# Patient Record
Sex: Male | Born: 1980 | Marital: Married | Smoking: Never smoker
Health system: Southern US, Community
[De-identification: ages and names within clinical notes are randomized; demographics above are authoritative.]

## PROBLEM LIST (undated history)

## (undated) ENCOUNTER — Ambulatory Visit (HOSPITAL_COMMUNITY): Discharge status: Absent without leave | Payer: PRIVATE HEALTH INSURANCE

## (undated) DIAGNOSIS — K219 Gastro-esophageal reflux disease without esophagitis: Secondary | ICD-10-CM

## (undated) DIAGNOSIS — R739 Hyperglycemia, unspecified: Secondary | ICD-10-CM

## (undated) DIAGNOSIS — R195 Other fecal abnormalities: Secondary | ICD-10-CM

## (undated) DIAGNOSIS — K5900 Constipation, unspecified: Secondary | ICD-10-CM

## (undated) DIAGNOSIS — O98719 Human immunodeficiency virus [HIV] disease complicating pregnancy, unspecified trimester: Secondary | ICD-10-CM

## (undated) DIAGNOSIS — Z227 Latent tuberculosis: Principal | ICD-10-CM

## (undated) DIAGNOSIS — B20 Human immunodeficiency virus [HIV] disease: Secondary | ICD-10-CM

## (undated) DIAGNOSIS — B86 Scabies: Principal | ICD-10-CM

## (undated) HISTORY — DX: Other fecal abnormalities: R19.5

## (undated) HISTORY — DX: Constipation, unspecified: K59.00

## (undated) HISTORY — DX: Scabies: B86

## (undated) HISTORY — DX: Human immunodeficiency virus (HIV) disease: B20

## (undated) HISTORY — DX: Hyperglycemia, unspecified: R73.9

## (undated) HISTORY — DX: Human immunodeficiency virus (HIV) disease complicating pregnancy, unspecified trimester: O98.719

## (undated) HISTORY — PX: OTHER SURGICAL HISTORY: SHX169

## (undated) HISTORY — DX: Gastro-esophageal reflux disease without esophagitis: K21.9

## (undated) HISTORY — DX: Latent tuberculosis: Z22.7

---

## 2013-01-17 ENCOUNTER — Telehealth: Payer: Self-pay

## 2013-01-17 NOTE — Telephone Encounter (Signed)
[facility] referral for patient and his wife. They do not speak English and will need interpreter services. Sponsored through [employer] , Evon Slack  [phone]. [facility] using interperter  Badal Gurung @ [phone] Patients do not have a telephone.   Laurell Josephs, RN

## 2013-01-23 ENCOUNTER — Other Ambulatory Visit: Payer: Self-pay | Admitting: Infectious Diseases

## 2013-01-23 ENCOUNTER — Ambulatory Visit
Admission: RE | Admit: 2013-01-23 | Discharge: 2013-01-23 | Disposition: A | Discharge status: Home / Self Care | Payer: No Typology Code available for payment source | Source: Ambulatory Visit | Attending: Infectious Diseases | Admitting: Infectious Diseases

## 2013-01-23 DIAGNOSIS — A150 Tuberculosis of lung: Secondary | ICD-10-CM

## 2013-01-30 ENCOUNTER — Ambulatory Visit: Payer: Self-pay

## 2013-01-30 ENCOUNTER — Ambulatory Visit: Payer: Medicaid Other

## 2013-01-30 ENCOUNTER — Other Ambulatory Visit (HOSPITAL_COMMUNITY)
Admission: RE | Admit: 2013-01-30 | Discharge: 2013-01-30 | Disposition: A | Discharge status: Home / Self Care | Payer: Medicaid Other | Source: Ambulatory Visit | Attending: Infectious Disease | Admitting: Infectious Disease

## 2013-01-30 DIAGNOSIS — Z113 Encounter for screening for infections with a predominantly sexual mode of transmission: Secondary | ICD-10-CM | POA: Insufficient documentation

## 2013-01-30 DIAGNOSIS — B20 Human immunodeficiency virus [HIV] disease: Secondary | ICD-10-CM

## 2013-01-30 LAB — CBC WITH DIFFERENTIAL/PLATELET
HCT: 44.4 % (ref 39.0–52.0)
Hemoglobin: 15.2 g/dL (ref 13.0–17.0)
Lymphocytes Relative: 26 % (ref 12–46)
Lymphs Abs: 1.5 10*3/uL (ref 0.7–4.0)
MCHC: 34.2 g/dL (ref 30.0–36.0)
Monocytes Absolute: 0.5 10*3/uL (ref 0.1–1.0)
Monocytes Relative: 8 % (ref 3–12)
Neutro Abs: 3.5 10*3/uL (ref 1.7–7.7)
Neutrophils Relative %: 61 % (ref 43–77)
RBC: 5.3 MIL/uL (ref 4.22–5.81)
WBC: 5.7 10*3/uL (ref 4.0–10.5)

## 2013-01-30 LAB — COMPLETE METABOLIC PANEL WITH GFR
ALT: 67 U/L — ABNORMAL HIGH (ref 0–53)
AST: 61 U/L — ABNORMAL HIGH (ref 0–37)
Alkaline Phosphatase: 60 U/L (ref 39–117)
CO2: 27 mEq/L (ref 19–32)
Chloride: 103 mEq/L (ref 96–112)
Creat: 0.8 mg/dL (ref 0.50–1.35)
GFR, Est African American: >89 mL/min
Glucose, Bld: 109 mg/dL — ABNORMAL HIGH (ref 70–99)
Potassium: 4 mEq/L (ref 3.5–5.3)
Sodium: 136 mEq/L (ref 135–145)
Total Bilirubin: 0.6 mg/dL (ref 0.3–1.2)

## 2013-01-30 LAB — LIPID PANEL
Cholesterol: 224 mg/dL — ABNORMAL HIGH (ref 0–200)
Total CHOL/HDL Ratio: 5.5 Ratio
Triglycerides: 241 mg/dL — ABNORMAL HIGH (ref <150)
VLDL: 48 mg/dL — ABNORMAL HIGH (ref 0–40)

## 2013-01-31 LAB — HEPATITIS B SURFACE ANTIGEN: Hepatitis B Surface Ag: NEGATIVE

## 2013-01-31 LAB — HEPATITIS A ANTIBODY, TOTAL: Hep A Total Ab: POSITIVE — AB

## 2013-01-31 LAB — URINALYSIS
Bilirubin Urine: NEGATIVE
Glucose, UA: NEGATIVE mg/dL
Specific Gravity, Urine: 1.007 (ref 1.005–1.030)

## 2013-01-31 LAB — HEPATITIS C ANTIBODY: HCV Ab: NEGATIVE

## 2013-01-31 LAB — HEPATITIS B SURFACE ANTIBODY,QUALITATIVE: Hep B S Ab: NONREACTIVE

## 2013-01-31 NOTE — Progress Notes (Signed)
Pt is a [place] immigrant.   He has been HIV positive since 2011 and has never been ART medications but has taken Septra before.   Source of infection is unknown. I did not ask sexual history questions since multiple people present at intake and we we speaking thorough an interpreter.  Pt is married and his spouse is also HIV positive.  Immigration physical documents CXR suggest discrete linear opacity , sputum smears and culture were negative on 08-21-2012.  RPR negative 08-21-2012 Last CD-4  =[phone]    Laurell Josephs, RN

## 2013-02-06 LAB — HIV-1 GENOTYPR PLUS

## 2013-02-19 ENCOUNTER — Encounter: Payer: Self-pay | Admitting: Infectious Disease

## 2013-02-19 ENCOUNTER — Ambulatory Visit (INDEPENDENT_AMBULATORY_CARE_PROVIDER_SITE_OTHER): Payer: Medicaid Other | Admitting: Infectious Disease

## 2013-02-19 VITALS — BP 160/94 | HR 90 | Temp 98.2°F | Wt 150.0 lb

## 2013-02-19 DIAGNOSIS — B20 Human immunodeficiency virus [HIV] disease: Secondary | ICD-10-CM | POA: Insufficient documentation

## 2013-02-19 DIAGNOSIS — Z23 Encounter for immunization: Secondary | ICD-10-CM

## 2013-02-19 DIAGNOSIS — J020 Streptococcal pharyngitis: Secondary | ICD-10-CM

## 2013-02-19 DIAGNOSIS — J029 Acute pharyngitis, unspecified: Secondary | ICD-10-CM

## 2013-02-19 DIAGNOSIS — R894 Abnormal immunological findings in specimens from other organs, systems and tissues: Secondary | ICD-10-CM

## 2013-02-19 DIAGNOSIS — R768 Other specified abnormal immunological findings in serum: Secondary | ICD-10-CM

## 2013-02-19 MED ORDER — EMTRICITABINE-TENOFOVIR DF 200-300 MG PO TABS: 1.0000 | ORAL_TABLET | Freq: Every day | ORAL | Status: DC

## 2013-02-19 MED ORDER — DOLUTEGRAVIR SODIUM 50 MG PO TABS: 50.0000 mg | ORAL_TABLET | Freq: Every day | ORAL | Status: DC

## 2013-02-19 NOTE — Addendum Note (Signed)
Addended by: Randall Hiss on: 02/19/2013 06:34 PM   Modules accepted: Level of Service

## 2013-02-19 NOTE — Progress Notes (Addendum)
Subjective:    Patient ID: Samuel Bond, male    DOB: [birth year], 32 y.o.   MRN: [id number]  HPI   32 year old [place] man who comes to clinic today accompanied by his wife. He is HIV infected he has never been on antiretroviral therapy his viral load is in the 7000 range with a wild type virus and to resistance dictations. His CD4 count is just above 300.  He has been worked up for possible tuberculosis due to a linear scar on chest x-ray with 3 sputum that were negative for AFB by smear and culture.  He had no other specific complaints prior to his traveling I states her since his arrival. He is accompanied again by his HIV infected wife who has been on antiretroviral therapy during her pregnancy but not for the past 2 years.  I spent greater than 60 minutes with the patient including greater than 50% of time in face to face counsel of the patient regarding  HIV, natural history current recommendations for therapy and antiretroviral regimens that we can consider for him and in coordination of their care. We decided to go with Tivicay and Truvada.      Review of Systems  Constitutional: Negative for fever, chills, diaphoresis, activity change, appetite change, fatigue and unexpected weight change.  HENT: Negative for congestion, sore throat, rhinorrhea, sneezing, trouble swallowing and sinus pressure.   Eyes: Negative for photophobia and visual disturbance.  Respiratory: Negative for cough, chest tightness, shortness of breath, wheezing and stridor.   Cardiovascular: Negative for chest pain, palpitations and leg swelling.  Gastrointestinal: Negative for nausea, vomiting, abdominal pain, diarrhea, constipation, blood in stool, abdominal distention and anal bleeding.  Genitourinary: Negative for dysuria, hematuria, flank pain and difficulty urinating.  Musculoskeletal: Negative for myalgias, back pain, joint swelling, arthralgias and gait problem.  Skin: Negative for color change,  pallor, rash and wound.  Neurological: Negative for dizziness, tremors, weakness and light-headedness.  Hematological: Negative for adenopathy. Does not bruise/bleed easily.  Psychiatric/Behavioral: Negative for behavioral problems, confusion, sleep disturbance, dysphoric mood, decreased concentration and agitation.       Objective:   Physical Exam  Constitutional: He is oriented to person, place, and time. He appears well-developed and well-nourished. No distress.  HENT:  Head: Normocephalic and atraumatic.  Mouth/Throat: Oropharynx is clear and moist. No oropharyngeal exudate.  Eyes: Conjunctivae and EOM are normal. Pupils are equal, round, and reactive to light. No scleral icterus.  Neck: Normal range of motion. Neck supple. No JVD present.  Cardiovascular: Normal rate, regular rhythm and normal heart sounds.  Exam reveals no gallop and no friction rub.   No murmur heard. Pulmonary/Chest: Effort normal and breath sounds normal. No respiratory distress. He has no wheezes. He has no rales. He exhibits no tenderness.  Abdominal: He exhibits no distension and no mass. There is no tenderness. There is no rebound and no guarding.  Musculoskeletal: He exhibits no edema and no tenderness.  Lymphadenopathy:    He has no cervical adenopathy.  Neurological: He is alert and oriented to person, place, and time. He has normal reflexes. He exhibits normal muscle tone. Coordination normal.  Skin: Skin is warm and dry. He is not diaphoretic. No erythema. No pallor.  Psychiatric: He has a normal mood and affect. His behavior is normal. Judgment and thought content normal.          Assessment & Plan:  HIV : start Tivicay and Truvada and bring back to clinic in 2 months time  Healthcare maintenance patient will get pneumonia shot today and hep B #1  Hep A positive: so no need for vax

## 2013-03-20 ENCOUNTER — Ambulatory Visit (INDEPENDENT_AMBULATORY_CARE_PROVIDER_SITE_OTHER): Payer: Medicaid Other | Admitting: *Deleted

## 2013-03-20 DIAGNOSIS — B20 Human immunodeficiency virus [HIV] disease: Secondary | ICD-10-CM

## 2013-03-20 DIAGNOSIS — Z23 Encounter for immunization: Secondary | ICD-10-CM

## 2013-03-20 NOTE — Patient Instructions (Signed)
Return in December for #3 dose.

## 2013-04-09 ENCOUNTER — Other Ambulatory Visit: Payer: Medicaid Other

## 2013-04-09 DIAGNOSIS — B20 Human immunodeficiency virus [HIV] disease: Secondary | ICD-10-CM

## 2013-04-09 LAB — COMPLETE METABOLIC PANEL WITH GFR
ALT: 15 U/L (ref 0–53)
AST: 29 U/L (ref 0–37)
Albumin: 4.5 g/dL (ref 3.5–5.2)
Alkaline Phosphatase: 73 U/L (ref 39–117)
BUN: 11 mg/dL (ref 6–23)
Calcium: 10.2 mg/dL (ref 8.4–10.5)
Chloride: 104 mEq/L (ref 96–112)
Potassium: 3.9 mEq/L (ref 3.5–5.3)
Sodium: 138 mEq/L (ref 135–145)
Total Protein: 8 g/dL (ref 6.0–8.3)

## 2013-04-09 LAB — CBC WITH DIFFERENTIAL/PLATELET
Basophils Absolute: 0 10*3/uL (ref 0.0–0.1)
Eosinophils Absolute: 0.2 10*3/uL (ref 0.0–0.7)
Eosinophils Relative: 4 % (ref 0–5)
HCT: 45.6 % (ref 39.0–52.0)
MCH: 28.9 pg (ref 26.0–34.0)
MCHC: 34.2 g/dL (ref 30.0–36.0)
MCV: 84.6 fL (ref 78.0–100.0)
Monocytes Absolute: 0.4 10*3/uL (ref 0.1–1.0)
Platelets: 239 10*3/uL (ref 150–400)
RDW: 14.1 % (ref 11.5–15.5)

## 2013-04-10 LAB — T-HELPER CELL (CD4) - (RCID CLINIC ONLY)
CD4 % Helper T Cell: 27 % — ABNORMAL LOW (ref 33–55)
CD4 T Cell Abs: 630 uL (ref 400–2700)

## 2013-04-10 LAB — HIV-1 RNA QUANT-NO REFLEX-BLD
HIV 1 RNA Quant: <20 copies/mL (ref <20)
HIV-1 RNA Quant, Log: <1.3 {Log} (ref <1.30)

## 2013-04-23 ENCOUNTER — Ambulatory Visit (INDEPENDENT_AMBULATORY_CARE_PROVIDER_SITE_OTHER): Payer: Medicaid Other | Admitting: Infectious Disease

## 2013-04-23 ENCOUNTER — Encounter: Payer: Self-pay | Admitting: Infectious Disease

## 2013-04-23 VITALS — BP 150/93 | HR 73 | Temp 98.4°F | Ht 68.0 in | Wt 151.0 lb

## 2013-04-23 DIAGNOSIS — A150 Tuberculosis of lung: Secondary | ICD-10-CM

## 2013-04-23 DIAGNOSIS — B20 Human immunodeficiency virus [HIV] disease: Secondary | ICD-10-CM

## 2013-04-23 DIAGNOSIS — K219 Gastro-esophageal reflux disease without esophagitis: Secondary | ICD-10-CM

## 2013-04-23 DIAGNOSIS — Z227 Latent tuberculosis: Secondary | ICD-10-CM

## 2013-04-23 MED ORDER — OMEPRAZOLE 20 MG PO CPDR: 20.0000 mg | DELAYED_RELEASE_CAPSULE | Freq: Every day | ORAL | Status: DC

## 2013-04-23 NOTE — Progress Notes (Signed)
  Subjective:    Patient ID: Samuel Bond, male    DOB: 02-17-1981, 32 y.o.   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 He has perfect virological suppresion with VL <20 and CD4 above 600 now  He has recently started rx for LTB and had problems with reflux for which he seeks rx from me. OTherwise no complaints.    Review of Systems  Constitutional: Negative for fever, chills, diaphoresis, activity change, appetite change, fatigue and unexpected weight change.  HENT: Negative for congestion, sore throat, rhinorrhea, sneezing, trouble swallowing and sinus pressure.   Eyes: Negative for photophobia and visual disturbance.  Respiratory: Negative for cough, chest tightness, shortness of breath, wheezing and stridor.   Cardiovascular: Negative for chest pain, palpitations and leg swelling.  Gastrointestinal: Negative for nausea, vomiting, abdominal pain, diarrhea, constipation, blood in stool, abdominal distention and anal bleeding.  Genitourinary: Negative for dysuria, hematuria, flank pain and difficulty urinating.  Musculoskeletal: Negative for myalgias, back pain, joint swelling, arthralgias and gait problem.  Skin: Negative for color change, pallor, rash and wound.  Neurological: Negative for dizziness, tremors, weakness and light-headedness.  Hematological: Negative for adenopathy. Does not bruise/bleed easily.  Psychiatric/Behavioral: Negative for behavioral problems, confusion, sleep disturbance, dysphoric mood, decreased concentration and agitation.       Objective:   Physical Exam  Constitutional: He is oriented to person, place, and time. He appears well-developed and well-nourished. No distress.  HENT:  Head: Normocephalic and atraumatic.  Mouth/Throat: Oropharynx is clear and moist. No oropharyngeal exudate.  Eyes: Conjunctivae and EOM are normal. Pupils are equal, round, and reactive to light. No  scleral icterus.  Neck: Normal range of motion. Neck supple. No JVD present.  Cardiovascular: Normal rate, regular rhythm and normal heart sounds.  Exam reveals no gallop and no friction rub.   No murmur heard. Pulmonary/Chest: Effort normal and breath sounds normal. No respiratory distress. He has no wheezes. He has no rales. He exhibits no tenderness.  Abdominal: He exhibits no distension and no mass. There is no tenderness. There is no rebound and no guarding.  Musculoskeletal: He exhibits no edema and no tenderness.  Lymphadenopathy:    He has no cervical adenopathy.  Neurological: He is alert and oriented to person, place, and time. He has normal reflexes. He exhibits normal muscle tone. Coordination normal.  Skin: Skin is warm and dry. He is not diaphoretic. No erythema. No pallor.  Psychiatric: He has a normal mood and affect. His behavior is normal. Judgment and thought content normal.          Assessment & Plan:  HIV : start Tivicay and Truvada and bring back in 3 months  GERD: start prilosec  LTB: on rx for TB once done with this take off his pPI and see if doesn't need this

## 2013-07-24 ENCOUNTER — Other Ambulatory Visit: Payer: Self-pay

## 2013-08-11 ENCOUNTER — Other Ambulatory Visit (INDEPENDENT_AMBULATORY_CARE_PROVIDER_SITE_OTHER): Payer: Medicaid Other

## 2013-08-11 DIAGNOSIS — B20 Human immunodeficiency virus [HIV] disease: Secondary | ICD-10-CM

## 2013-08-11 LAB — CBC WITH DIFFERENTIAL/PLATELET
Basophils Absolute: 0 10*3/uL (ref 0.0–0.1)
Eosinophils Relative: 2 % (ref 0–5)
HCT: 47.1 % (ref 39.0–52.0)
Hemoglobin: 16.8 g/dL (ref 13.0–17.0)
Lymphocytes Relative: 27 % (ref 12–46)
Lymphs Abs: 1.8 10*3/uL (ref 0.7–4.0)
MCV: 84.3 fL (ref 78.0–100.0)
Monocytes Absolute: 0.4 10*3/uL (ref 0.1–1.0)
Monocytes Relative: 6 % (ref 3–12)
Neutrophils Relative %: 65 % (ref 43–77)
RBC: 5.59 MIL/uL (ref 4.22–5.81)
RDW: 13.7 % (ref 11.5–15.5)
WBC: 6.5 10*3/uL (ref 4.0–10.5)

## 2013-08-11 LAB — COMPLETE METABOLIC PANEL WITH GFR
AST: 25 U/L (ref 0–37)
Albumin: 4.9 g/dL (ref 3.5–5.2)
Alkaline Phosphatase: 83 U/L (ref 39–117)
BUN: 10 mg/dL (ref 6–23)
Creat: 0.91 mg/dL (ref 0.50–1.35)
GFR, Est African American: >89 mL/min
Glucose, Bld: 105 mg/dL — ABNORMAL HIGH (ref 70–99)
Potassium: 3.9 mEq/L (ref 3.5–5.3)
Sodium: 138 mEq/L (ref 135–145)
Total Bilirubin: 0.3 mg/dL (ref 0.3–1.2)
Total Protein: 8.4 g/dL — ABNORMAL HIGH (ref 6.0–8.3)

## 2013-08-13 LAB — HIV-1 RNA QUANT-NO REFLEX-BLD: HIV-1 RNA Quant, Log: <1.3 {Log} (ref <1.30)

## 2013-08-25 ENCOUNTER — Ambulatory Visit: Payer: Medicaid Other | Admitting: Infectious Disease

## 2013-08-28 ENCOUNTER — Ambulatory Visit (INDEPENDENT_AMBULATORY_CARE_PROVIDER_SITE_OTHER): Payer: Medicaid Other | Admitting: *Deleted

## 2013-08-28 DIAGNOSIS — Z23 Encounter for immunization: Secondary | ICD-10-CM

## 2013-08-28 DIAGNOSIS — B20 Human immunodeficiency virus [HIV] disease: Secondary | ICD-10-CM

## 2013-09-03 ENCOUNTER — Ambulatory Visit (INDEPENDENT_AMBULATORY_CARE_PROVIDER_SITE_OTHER): Payer: Medicaid Other | Admitting: Infectious Disease

## 2013-09-03 ENCOUNTER — Encounter: Payer: Self-pay | Admitting: Infectious Disease

## 2013-09-03 VITALS — BP 124/85 | HR 71 | Temp 98.2°F | Wt 150.0 lb

## 2013-09-03 DIAGNOSIS — Z227 Latent tuberculosis: Secondary | ICD-10-CM

## 2013-09-03 DIAGNOSIS — K219 Gastro-esophageal reflux disease without esophagitis: Secondary | ICD-10-CM

## 2013-09-03 DIAGNOSIS — Z23 Encounter for immunization: Secondary | ICD-10-CM

## 2013-09-03 DIAGNOSIS — A150 Tuberculosis of lung: Secondary | ICD-10-CM

## 2013-09-03 DIAGNOSIS — B20 Human immunodeficiency virus [HIV] disease: Secondary | ICD-10-CM

## 2013-09-03 MED ORDER — ABACAVIR-DOLUTEGRAVIR-LAMIVUD 600-50-300 MG PO TABS: 1.0000 | ORAL_TABLET | Freq: Every day | ORAL | Status: DC

## 2013-09-03 NOTE — Progress Notes (Signed)
  Subjective:    Patient ID: Samuel Bond, male    DOB: 11-26-1980, 32 y.o.   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 He has perfect virological suppresion with VL <20 and CD4 above 600 whom has been doing well and who would like to change over to [facility] which we will do today. He is without other complaints.   Review of Systems  Constitutional: Negative for fever, chills, diaphoresis, activity change, appetite change, fatigue and unexpected weight change.  HENT: Negative for congestion, rhinorrhea, sinus pressure, sneezing, sore throat and trouble swallowing.   Eyes: Negative for photophobia and visual disturbance.  Respiratory: Negative for cough, chest tightness, shortness of breath, wheezing and stridor.   Cardiovascular: Negative for chest pain, palpitations and leg swelling.  Gastrointestinal: Negative for nausea, vomiting, abdominal pain, diarrhea, constipation, blood in stool, abdominal distention and anal bleeding.  Genitourinary: Negative for dysuria, hematuria, flank pain and difficulty urinating.  Musculoskeletal: Negative for arthralgias, back pain, gait problem, joint swelling and myalgias.  Skin: Negative for color change, pallor, rash and wound.  Neurological: Negative for dizziness, tremors, weakness and light-headedness.  Hematological: Negative for adenopathy. Does not bruise/bleed easily.  Psychiatric/Behavioral: Negative for behavioral problems, confusion, sleep disturbance, dysphoric mood, decreased concentration and agitation.       Objective:   Physical Exam  Constitutional: He is oriented to person, place, and time. He appears well-developed and well-nourished. No distress.  HENT:  Head: Normocephalic and atraumatic.  Mouth/Throat: Oropharynx is clear and moist. No oropharyngeal exudate.  Eyes: Conjunctivae and EOM are normal. Pupils are equal, round, and reactive to light. No  scleral icterus.  Neck: Normal range of motion. Neck supple. No JVD present.  Cardiovascular: Normal rate, regular rhythm and normal heart sounds.  Exam reveals no gallop and no friction rub.   No murmur heard. Pulmonary/Chest: Effort normal and breath sounds normal. No respiratory distress. He has no wheezes. He has no rales. He exhibits no tenderness.  Abdominal: He exhibits no distension and no mass. There is no tenderness. There is no rebound and no guarding.  Musculoskeletal: He exhibits no edema and no tenderness.  Lymphadenopathy:    He has no cervical adenopathy.  Neurological: He is alert and oriented to person, place, and time. He has normal reflexes. He exhibits normal muscle tone. Coordination normal.  Skin: Skin is warm and dry. He is not diaphoretic. No erythema. No pallor.  Psychiatric: He has a normal mood and affect. His behavior is normal. Judgment and thought content normal.          Assessment & Plan:  HIV : switch to TRIUMEQ and rtc in 2 months. I spent greater than 25 minutes with the patient including greater than 50% of time in face to face counsel of the patient and in coordination of their care.   GERD: ok to use prilosec  LTB: need to check if he has finished this yet  Need for flu vaccine: given

## 2013-09-09 ENCOUNTER — Encounter: Payer: Self-pay | Admitting: Internal Medicine

## 2013-09-09 ENCOUNTER — Ambulatory Visit: Payer: Medicaid Other | Attending: Internal Medicine | Admitting: Internal Medicine

## 2013-09-09 VITALS — BP 128/92 | HR 78 | Temp 98.7°F | Resp 14 | Ht 64.0 in | Wt 149.2 lb

## 2013-09-09 DIAGNOSIS — J209 Acute bronchitis, unspecified: Secondary | ICD-10-CM | POA: Insufficient documentation

## 2013-09-09 DIAGNOSIS — Z21 Asymptomatic human immunodeficiency virus [HIV] infection status: Secondary | ICD-10-CM | POA: Insufficient documentation

## 2013-09-09 LAB — COMPLETE METABOLIC PANEL WITH GFR
AST: 52 U/L — ABNORMAL HIGH (ref 0–37)
Albumin: 4.7 g/dL (ref 3.5–5.2)
BUN: 9 mg/dL (ref 6–23)
Calcium: 9.6 mg/dL (ref 8.4–10.5)
Chloride: 105 mEq/L (ref 96–112)
Creat: 0.88 mg/dL (ref 0.50–1.35)
GFR, Est Non African American: >89 mL/min
Glucose, Bld: 103 mg/dL — ABNORMAL HIGH (ref 70–99)
Potassium: 3.9 mEq/L (ref 3.5–5.3)
Sodium: 138 mEq/L (ref 135–145)
Total Protein: 7.7 g/dL (ref 6.0–8.3)

## 2013-09-09 LAB — CBC WITH DIFFERENTIAL/PLATELET
Basophils Absolute: 0 10*3/uL (ref 0.0–0.1)
Eosinophils Absolute: 0.1 10*3/uL (ref 0.0–0.7)
Eosinophils Relative: 2 % (ref 0–5)
HCT: 45.2 % (ref 39.0–52.0)
Hemoglobin: 16.2 g/dL (ref 13.0–17.0)
Lymphocytes Relative: 22 % (ref 12–46)
Lymphs Abs: 1.5 10*3/uL (ref 0.7–4.0)
MCV: 85.6 fL (ref 78.0–100.0)
Monocytes Absolute: 0.5 10*3/uL (ref 0.1–1.0)
Monocytes Relative: 7 % (ref 3–12)
RBC: 5.28 MIL/uL (ref 4.22–5.81)
RDW: 13.3 % (ref 11.5–15.5)
WBC: 6.9 10*3/uL (ref 4.0–10.5)

## 2013-09-09 LAB — TSH: TSH: 3.71 u[IU]/mL (ref 0.350–4.500)

## 2013-09-09 LAB — LIPID PANEL
HDL: 33 mg/dL — ABNORMAL LOW (ref >39)
LDL Cholesterol: 82 mg/dL (ref 0–99)
Triglycerides: 121 mg/dL (ref <150)
VLDL: 24 mg/dL (ref 0–40)

## 2013-09-09 MED ORDER — AZITHROMYCIN 500 MG PO TABS: ORAL_TABLET | ORAL | Status: DC

## 2013-09-09 MED ORDER — GUAIFENESIN-CODEINE 100-10 MG/5ML PO SOLN: 5.0000 mL | ORAL | Status: DC | PRN

## 2013-09-09 NOTE — Progress Notes (Signed)
Pt is here to establish. Was recently treated for TB exposure. Pt is HIV positive.

## 2013-09-09 NOTE — Progress Notes (Signed)
Patient ID: Samuel Bond, male   DOB: 22-Oct-1980, 32 y.o.   MRN: [id number]   CC:  HPI: 32 year old male here to establish care. He is a history of HIV. 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 He is under the care of Dr. Earlean Polka June and started on Tivicay and Truvada. He has perfect virological suppresion with VL <20 and CD4 above 600 now . He recently changed over to [facility]   Today he states that he's had a cough for the last 2 weeks. He denies any fever denies any headache. Denies any runny nose or postnasal drip. He does have a sore throat.  No Known Allergies Past Medical History  Diagnosis Date  . HIV disease   . Maternal HIV infection during pregnancy    Current Outpatient Prescriptions on File Prior to Visit  Medication Sig Dispense Refill  . Abacavir-Dolutegravir-Lamivud (TRIUMEQ) 600-50-300 MG TABS Take 1 tablet by mouth daily.  30 tablet  11  . omeprazole (PRILOSEC) 20 MG capsule Take 1 capsule (20 mg total) by mouth daily.  30 capsule  11   No current facility-administered medications on file prior to visit.   Family History  Problem Relation Age of Onset  . Asthma Father    History   Social History  . Marital Status: Married    Spouse Name: N/A    Number of Children: N/A  . Years of Education: N/A   Occupational History  . Not on file.   Social History Main Topics  . Smoking status: Never Smoker   . Smokeless tobacco: Not on file  . Alcohol Use: No  . Drug Use: No  . Sexual Activity: Yes    Partners: Female     Comment: pt declined condoms   Other Topics Concern  . Not on file   Social History Narrative  . No narrative on file    Review of Systems  Constitutional: Negative for fever, chills, diaphoresis, activity change, appetite change and fatigue.  HENT: Negative for ear pain, nosebleeds, congestion, facial swelling, rhinorrhea, neck pain, neck stiffness and ear discharge.   Eyes: Negative for pain, discharge,  redness, itching and visual disturbance.  Respiratory: Positive for cough, choking, chest tightness, shortness of breath, wheezing and stridor.   Cardiovascular: Negative for chest pain, palpitations and leg swelling.  Gastrointestinal: Negative for abdominal distention.  Genitourinary: Negative for dysuria, urgency, frequency, hematuria, flank pain, decreased urine volume, difficulty urinating and dyspareunia.  Musculoskeletal: Negative for back pain, joint swelling, arthralgias and gait problem.  Neurological: Negative for dizziness, tremors, seizures, syncope, facial asymmetry, speech difficulty, weakness, light-headedness, numbness and headaches.  Hematological: Negative for adenopathy. Does not bruise/bleed easily.  Psychiatric/Behavioral: Negative for hallucinations, behavioral problems, confusion, dysphoric mood, decreased concentration and agitation.    Objective:   Filed Vitals:   09/09/13 0905  BP: 128/92  Pulse: 78  Temp: 98.7 F (37.1 C)  Resp: 14    Physical Exam  Constitutional: Appears well-developed and well-nourished. No distress.  HENT: Normocephalic. External right and left ear normal. Oropharynx is clear and moist.  Eyes: Conjunctivae and EOM are normal. PERRLA, no scleral icterus.  Neck: Normal ROM. Neck supple. No JVD. No tracheal deviation. No thyromegaly.  CVS: RRR, S1/S2 +, no murmurs, no gallops, no carotid bruit.  Pulmonary: Effort and breath sounds normal, no stridor, rhonchi, wheezes, rales.  Abdominal: Soft. BS +,  no distension, tenderness, rebound or guarding.  Musculoskeletal: Normal range of motion. No edema and no tenderness.  Lymphadenopathy:  No lymphadenopathy noted, cervical, inguinal. Neuro: Alert. Normal reflexes, muscle tone coordination. No cranial nerve deficit. Skin: Skin is warm and dry. No rash noted. Not diaphoretic. No erythema. No pallor.  Psychiatric: Normal mood and affect. Behavior, judgment, thought content normal.   Lab Results   Component Value Date   WBC 6.5 08/11/2013   HGB 16.8 08/11/2013   HCT 47.1 08/11/2013   MCV 84.3 08/11/2013   PLT 240 08/11/2013   Lab Results  Component Value Date   CREATININE 0.91 08/11/2013   BUN 10 08/11/2013   NA 138 08/11/2013   K 3.9 08/11/2013   CL 104 08/11/2013   CO2 25 08/11/2013    No results found for this basename: HGBA1C   Lipid Panel     Component Value Date/Time   CHOL 224* 01/30/2013 1459   TRIG 241* 01/30/2013 1459   HDL 41 01/30/2013 1459   CHOLHDL 5.5 01/30/2013 1459   VLDL 48* 01/30/2013 1459   LDLCALC 135* 01/30/2013 1459       Assessment and plan:   Patient Active Problem List   Diagnosis Date Noted  . GERD (gastroesophageal reflux disease) 04/23/2013  . HIV disease 02/19/2013  . Sore throat 02/19/2013    Acute bronchitis We'll obtain a chest x-ray Influenza A and B. probable stroke Prescribe Z-Pak for 7 days Robitussin with codeine If no improvement the patient will need further workup  HIV followed by infectious disease, he is up-to-date with his flu vaccination   Hypertriglyceridemia Will repeat lipid panel in about 3 months. The patient on treatment as needed  Followup in 3 months   The patient was given clear instructions to go to ER or return to medical center if symptoms don't improve, worsen or new problems develop. The patient verbalized understanding. The patient was told to call to get any lab results if not heard anything in the next week.

## 2013-09-15 ENCOUNTER — Ambulatory Visit (HOSPITAL_COMMUNITY)
Admission: RE | Admit: 2013-09-15 | Discharge: 2013-09-15 | Disposition: A | Discharge status: Home / Self Care | Payer: Medicaid Other | Source: Ambulatory Visit | Attending: Internal Medicine | Admitting: Internal Medicine

## 2013-09-15 ENCOUNTER — Telehealth: Payer: Self-pay | Admitting: *Deleted

## 2013-09-15 DIAGNOSIS — R059 Cough, unspecified: Secondary | ICD-10-CM | POA: Insufficient documentation

## 2013-09-15 DIAGNOSIS — J209 Acute bronchitis, unspecified: Secondary | ICD-10-CM

## 2013-09-15 DIAGNOSIS — R05 Cough: Secondary | ICD-10-CM | POA: Insufficient documentation

## 2013-09-15 DIAGNOSIS — M47814 Spondylosis without myelopathy or radiculopathy, thoracic region: Secondary | ICD-10-CM | POA: Insufficient documentation

## 2013-09-15 NOTE — Telephone Encounter (Signed)
Spoke with Lattie Haw and Nicholos Johns at [employer]. Was able to have Durga speak with the patient and make sure he is comfortable with his new regimen.  Per Nida Boatman spoke with the patient: he is tolerating the medication well, has no issues with the regimen, no other questions. Andree Coss, RN

## 2013-09-17 ENCOUNTER — Telehealth: Payer: Self-pay | Admitting: *Deleted

## 2013-09-17 NOTE — Telephone Encounter (Signed)
Message copied by Aronda Burford, [place] R on Wed Sep 17, 2013 10:59 AM ------      Message from: Susie Cassette MD, [facility]      Created: Mon Sep 15, 2013  5:49 PM       Please notify patient of the patient's labs are normal ------

## 2013-09-22 NOTE — Telephone Encounter (Signed)
Thanks Michelle

## 2013-10-28 ENCOUNTER — Other Ambulatory Visit: Payer: Medicaid Other

## 2013-10-30 ENCOUNTER — Telehealth: Payer: Self-pay | Admitting: *Deleted

## 2013-10-30 NOTE — Telephone Encounter (Signed)
Contacted patient with an interpreter. Unable to reach patient. Telephone line was busy.

## 2013-10-30 NOTE — Telephone Encounter (Signed)
Message copied by Archimedes Harold R on Thu Oct 30, 2013 [place] 1:07 PM ------      Message from: Susie CassetteABROL MD, Germain OsgoodNAYANA      Created: Tue Oct 21, 2013  3:37 PM       Notify patient of the chest x-ray done on 12/29 was negative ------

## 2013-11-03 ENCOUNTER — Other Ambulatory Visit (INDEPENDENT_AMBULATORY_CARE_PROVIDER_SITE_OTHER): Payer: Medicaid Other

## 2013-11-03 DIAGNOSIS — B20 Human immunodeficiency virus [HIV] disease: Secondary | ICD-10-CM

## 2013-11-03 LAB — CBC WITH DIFFERENTIAL/PLATELET
BASOS PCT: 0 % (ref 0–1)
Basophils Absolute: 0 10*3/uL (ref 0.0–0.1)
EOS ABS: 0.1 10*3/uL (ref 0.0–0.7)
EOS PCT: 3 % (ref 0–5)
HCT: 44.9 % (ref 39.0–52.0)
Hemoglobin: 15.4 g/dL (ref 13.0–17.0)
Lymphocytes Relative: 22 % (ref 12–46)
Lymphs Abs: 1.1 10*3/uL (ref 0.7–4.0)
MCH: 29.6 pg (ref 26.0–34.0)
MCHC: 34.3 g/dL (ref 30.0–36.0)
MCV: 86.3 fL (ref 78.0–100.0)
Monocytes Absolute: 0.3 10*3/uL (ref 0.1–1.0)
Monocytes Relative: 7 % (ref 3–12)
NEUTROS PCT: 68 % (ref 43–77)
Neutro Abs: 3.3 10*3/uL (ref 1.7–7.7)
PLATELETS: 235 10*3/uL (ref 150–400)
RBC: 5.2 MIL/uL (ref 4.22–5.81)
RDW: 14.6 % (ref 11.5–15.5)
WBC: 4.8 10*3/uL (ref 4.0–10.5)

## 2013-11-04 LAB — COMPLETE METABOLIC PANEL WITH GFR
ALT: 10 U/L (ref 0–53)
AST: 22 U/L (ref 0–37)
Albumin: 4.7 g/dL (ref 3.5–5.2)
Alkaline Phosphatase: 79 U/L (ref 39–117)
BILIRUBIN TOTAL: 0.6 mg/dL (ref 0.2–1.2)
BUN: 9 mg/dL (ref 6–23)
CALCIUM: 9.3 mg/dL (ref 8.4–10.5)
CHLORIDE: 106 meq/L (ref 96–112)
CO2: 23 meq/L (ref 19–32)
Creat: 0.96 mg/dL (ref 0.50–1.35)
GLUCOSE: 97 mg/dL (ref 70–99)
Potassium: 3.9 mEq/L (ref 3.5–5.3)
SODIUM: 137 meq/L (ref 135–145)
TOTAL PROTEIN: 7.6 g/dL (ref 6.0–8.3)

## 2013-11-04 LAB — T-HELPER CELL (CD4) - (RCID CLINIC ONLY)
CD4 % Helper T Cell: 33 % (ref 33–55)
CD4 T CELL ABS: 320 /uL — AB (ref 400–2700)

## 2013-11-04 LAB — HEPATITIS B SURFACE ANTIBODY,QUALITATIVE: Hep B S Ab: POSITIVE — AB

## 2013-11-05 LAB — HIV-1 RNA QUANT-NO REFLEX-BLD
HIV 1 RNA Quant: <20 copies/mL (ref <20)
HIV-1 RNA Quant, Log: <1.3 {Log} (ref <1.30)

## 2013-11-17 ENCOUNTER — Ambulatory Visit (INDEPENDENT_AMBULATORY_CARE_PROVIDER_SITE_OTHER): Payer: Medicaid Other | Admitting: Infectious Disease

## 2013-11-17 ENCOUNTER — Encounter: Payer: Self-pay | Admitting: Infectious Disease

## 2013-11-17 VITALS — BP 131/89 | HR 63 | Temp 98.0°F | Wt 149.0 lb

## 2013-11-17 DIAGNOSIS — K219 Gastro-esophageal reflux disease without esophagitis: Secondary | ICD-10-CM

## 2013-11-17 DIAGNOSIS — B20 Human immunodeficiency virus [HIV] disease: Secondary | ICD-10-CM

## 2013-11-17 NOTE — Progress Notes (Signed)
  Subjective:    Patient ID: Samuel Bond, male    DOB: 1981/09/15, 33 y.o.   MRN: [id number]  HPI   33 year old [name] with HIV and LTB whom I saw in June and started on Tivicay and Truvada. [facility] perfect virological suppression and healthy immune system with healthy CD4 count.  He is doing well although there is concern about whether his Medicaid will be renewed when his 3711 month Medicaid runs out this month.  He may need to be enrolled into ADAP. Review of Systems  Constitutional: Negative for fever, chills, diaphoresis, activity change, appetite change, fatigue and unexpected weight change.  HENT: Negative for congestion, rhinorrhea, sinus pressure, sneezing, sore throat and trouble swallowing.   Eyes: Negative for photophobia and visual disturbance.  Respiratory: Negative for cough, chest tightness, shortness of breath, wheezing and stridor.   Cardiovascular: Negative for chest pain, palpitations and leg swelling.  Gastrointestinal: Negative for nausea, vomiting, abdominal pain, diarrhea, constipation, blood in stool, abdominal distention and anal bleeding.  Genitourinary: Negative for dysuria, hematuria, flank pain and difficulty urinating.  Musculoskeletal: Negative for arthralgias, back pain, gait problem, joint swelling and myalgias.  Skin: Negative for color change, pallor, rash and wound.  Neurological: Negative for dizziness, tremors, weakness and light-headedness.  Hematological: Negative for adenopathy. Does not bruise/bleed easily.  Psychiatric/Behavioral: Negative for behavioral problems, confusion, sleep disturbance, dysphoric mood, decreased concentration and agitation.       Objective:   Physical Exam  Constitutional: He is oriented to person, place, and time. He appears well-developed and well-nourished. No distress.  HENT:  Head: Normocephalic and atraumatic.  Mouth/Throat: Oropharynx is clear and moist. No oropharyngeal exudate.  Eyes:  Conjunctivae and EOM are normal. Pupils are equal, round, and reactive to light. No scleral icterus.  Neck: Normal range of motion. Neck supple. No JVD present.  Cardiovascular: Normal rate, regular rhythm and normal heart sounds.  Exam reveals no gallop and no friction rub.   No murmur heard. Pulmonary/Chest: Effort normal and breath sounds normal. No respiratory distress. He has no wheezes. He has no rales. He exhibits no tenderness.  Abdominal: He exhibits no distension and no mass. There is no tenderness. There is no rebound and no guarding.  Musculoskeletal: He exhibits no edema and no tenderness.  Lymphadenopathy:    He has no cervical adenopathy.  Neurological: He is alert and oriented to person, place, and time. He has normal reflexes. He exhibits normal muscle tone. Coordination normal.  Skin: Skin is warm and dry. He is not diaphoretic. No erythema. No pallor.  Psychiatric: He has a normal mood and affect. His behavior is normal. Judgment and thought content normal.          Assessment & Plan:  HIV : Continue TRIUMEQ and rtc in 1 month to ensure that he is enrolled into ADAP  GERD: None issue for him at this point time we'll discontinue his Prilosec  LTB: need check if he completed therapy for this

## 2013-12-08 ENCOUNTER — Ambulatory Visit: Payer: Medicaid Other | Attending: Internal Medicine | Admitting: Internal Medicine

## 2013-12-08 VITALS — BP 135/87 | HR 74 | Temp 98.4°F | Resp 16 | Ht 68.0 in | Wt 149.4 lb

## 2013-12-08 DIAGNOSIS — B20 Human immunodeficiency virus [HIV] disease: Secondary | ICD-10-CM

## 2013-12-08 NOTE — Progress Notes (Signed)
Here for follow up States he has no other concerns

## 2013-12-08 NOTE — Progress Notes (Signed)
Patient ID: Samuel Bond, male   DOB: Nov 30, 1980, 33 y.o.   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 [facility] perfect virological suppression and healthy immune system with healthy CD4 count. He was last seen here for bronchitis, does not report any chest pain or cardiopulmonary symptoms today  Concerned about his Medicaid running out next month. He is requesting financial assistance     No Known Allergies Past Medical History  Diagnosis Date  . HIV disease   . Maternal HIV infection during pregnancy    Current Outpatient Prescriptions on File Prior to Visit  Medication Sig Dispense Refill  . Abacavir-Dolutegravir-Lamivud (TRIUMEQ) 600-50-300 MG TABS Take 1 tablet by mouth daily.  30 tablet  11   No current facility-administered medications on file prior to visit.   Family History  Problem Relation Age of Onset  . Asthma Father    History   Social History  . Marital Status: Married    Spouse Name: N/A    Number of Children: N/A  . Years of Education: N/A   Occupational History  . Not on file.   Social History Main Topics  . Smoking status: Never Smoker   . Smokeless tobacco: Not on file  . Alcohol Use: No  . Drug Use: No  . Sexual Activity: Yes    Partners: Female     Comment: pt declined condoms   Other Topics Concern  . Not on file   Social History Narrative  . No narrative on file    Review of Systems  Constitutional: Negative for fever, chills, diaphoresis, activity change, appetite change and fatigue.  HENT: Negative for ear pain, nosebleeds, congestion, facial swelling, rhinorrhea, neck pain, neck stiffness and ear discharge.   Eyes: Negative for pain, discharge, redness, itching and visual disturbance.  Respiratory: Negative for cough, choking, chest tightness, shortness of breath, wheezing and stridor.   Cardiovascular: Negative for chest pain,  palpitations and leg swelling.  Gastrointestinal: Negative for abdominal distention.  Genitourinary: Negative for dysuria, urgency, frequency, hematuria, flank pain, decreased urine volume, difficulty urinating and dyspareunia.  Musculoskeletal: Negative for back pain, joint swelling, arthralgias and gait problem.  Neurological: Negative for dizziness, tremors, seizures, syncope, facial asymmetry, speech difficulty, weakness, light-headedness, numbness and headaches.  Hematological: Negative for adenopathy. Does not bruise/bleed easily.  Psychiatric/Behavioral: Negative for hallucinations, behavioral problems, confusion, dysphoric mood, decreased concentration and agitation.    Objective:   Filed Vitals:   12/08/13 1017  BP: 135/87  Pulse: 74  Temp: 98.4 F (36.9 C)  Resp: 16    Physical Exam  Constitutional: Appears well-developed and well-nourished. No distress.  HENT: Normocephalic. External right and left ear normal. Oropharynx is clear and moist.  Eyes: Conjunctivae and EOM are normal. PERRLA, no scleral icterus.  Neck: Normal ROM. Neck supple. No JVD. No tracheal deviation. No thyromegaly.  CVS: RRR, S1/S2 +, no murmurs, no gallops, no carotid bruit.  Pulmonary: Effort and breath sounds normal, no stridor, rhonchi, wheezes, rales.  Abdominal: Soft. BS +,  no distension, tenderness, rebound or guarding.  Musculoskeletal: Normal range of motion. No edema and no tenderness.  Lymphadenopathy: No lymphadenopathy noted, cervical, inguinal. Neuro: Alert. Normal reflexes, muscle tone coordination. No cranial nerve deficit. Skin: Skin is warm and dry. No rash noted. Not diaphoretic. No erythema. No pallor.  Psychiatric: Normal mood and affect. Behavior, judgment, thought content normal.  Lab Results  Component Value Date   WBC 4.8 11/03/2013   HGB 15.4 11/03/2013   HCT 44.9 11/03/2013   MCV 86.3 11/03/2013   PLT 235 11/03/2013   Lab Results  Component Value Date   CREATININE 0.96  11/03/2013   BUN 9 11/03/2013   NA 137 11/03/2013   K 3.9 11/03/2013   CL 106 11/03/2013   CO2 23 11/03/2013    Lab Results  Component Value Date   HGBA1C 5.4 09/09/2013   Lipid Panel     Component Value Date/Time   CHOL 139 09/09/2013 0926   TRIG 121 09/09/2013 0926   HDL 33* 09/09/2013 0926   CHOLHDL 4.2 09/09/2013 0926   VLDL 24 09/09/2013 0926   LDLCALC 82 09/09/2013 0926       Assessment and plan:   Patient Active Problem List   Diagnosis Date Noted  . GERD (gastroesophageal reflux disease) 04/23/2013  . HIV disease 02/19/2013  . Sore throat 02/19/2013   HIV closely followed by infectious disease, he has a followup appointment in April with infectious disease  GERD continue Prilosec  Financial assistance, refer to financial counselor  Patient stable followup in 6 months       The patient was given clear instructions to go to ER or return to medical center if symptoms don't improve, worsen or new problems develop. The patient verbalized understanding. The patient was told to call to get any lab results if not heard anything in the next week.

## 2013-12-18 ENCOUNTER — Encounter: Payer: Self-pay | Admitting: Infectious Disease

## 2013-12-18 ENCOUNTER — Ambulatory Visit (INDEPENDENT_AMBULATORY_CARE_PROVIDER_SITE_OTHER): Payer: Medicaid Other | Admitting: Infectious Disease

## 2013-12-18 VITALS — BP 124/82 | HR 90 | Temp 98.2°F | Wt 146.0 lb

## 2013-12-18 DIAGNOSIS — B20 Human immunodeficiency virus [HIV] disease: Secondary | ICD-10-CM

## 2013-12-18 NOTE — Progress Notes (Signed)
  Subjective:    Patient ID: Samuel Bond, male    DOB: 1980/12/29, 33 y.o.   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 [facility] to [facility] perfect virological suppression and healthy immune system with healthy CD4 count.  He is doing well although there is concern about whether his Medicaid will be renewed which I understand it will not at in May.   He is working with Marthann SchillerMitch to make sure he meets with Tish and gets enrolled into ADAP.  Review of Systems  Constitutional: Negative for fever, chills, diaphoresis, activity change, appetite change, fatigue and unexpected weight change.  HENT: Negative for congestion, rhinorrhea, sinus pressure, sneezing, sore throat and trouble swallowing.   Eyes: Negative for photophobia and visual disturbance.  Respiratory: Negative for cough, chest tightness, shortness of breath, wheezing and stridor.   Cardiovascular: Negative for chest pain, palpitations and leg swelling.  Gastrointestinal: Negative for nausea, vomiting, abdominal pain, diarrhea, constipation, blood in stool, abdominal distention and anal bleeding.  Genitourinary: Negative for dysuria, hematuria, flank pain and difficulty urinating.  Musculoskeletal: Negative for arthralgias, back pain, gait problem, joint swelling and myalgias.  Skin: Negative for color change, pallor, rash and wound.  Neurological: Negative for dizziness, tremors, weakness and light-headedness.  Hematological: Negative for adenopathy. Does not bruise/bleed easily.  Psychiatric/Behavioral: Negative for behavioral problems, confusion, sleep disturbance, dysphoric mood, decreased concentration and agitation.       Objective:   Physical Exam  Constitutional: He is oriented to person, place, and time. He appears well-developed and well-nourished. No distress.  HENT:  Head: Normocephalic and atraumatic.  Mouth/Throat: Oropharynx is clear  and moist. No oropharyngeal exudate.  Eyes: Conjunctivae and EOM are normal. Pupils are equal, round, and reactive to light. No scleral icterus.  Neck: Normal range of motion. Neck supple. No JVD present.  Cardiovascular: Normal rate, regular rhythm and normal heart sounds.  Exam reveals no gallop and no friction rub.   No murmur heard. Pulmonary/Chest: Effort normal and breath sounds normal. No respiratory distress. He has no wheezes. He has no rales. He exhibits no tenderness.  Abdominal: He exhibits no distension and no mass. There is no tenderness. There is no rebound and no guarding.  Musculoskeletal: He exhibits no edema and no tenderness.  Lymphadenopathy:    He has no cervical adenopathy.  Neurological: He is alert and oriented to person, place, and time. He has normal reflexes. He exhibits normal muscle tone. Coordination normal.  Skin: Skin is warm and dry. He is not diaphoretic. No erythema. No pallor.  Psychiatric: He has a normal mood and affect. His behavior is normal. Judgment and thought content normal.          Assessment & Plan:  HIV : Continue TRIUMEQ ensure that he is enrolled into ADAP. Could need some patient assistance to bridge him if we run into any delay here. There should BE NO INTERUPTION IN ARV in this model pt with high compliance  GERD: None issue for him at this point time we'll discontinue his Prilosec  LTB: need check if he completed therapy for this

## 2013-12-26 ENCOUNTER — Encounter: Payer: Self-pay | Admitting: *Deleted

## 2013-12-29 ENCOUNTER — Other Ambulatory Visit: Payer: Self-pay | Admitting: *Deleted

## 2013-12-29 DIAGNOSIS — B20 Human immunodeficiency virus [HIV] disease: Secondary | ICD-10-CM

## 2013-12-29 MED ORDER — ABACAVIR-DOLUTEGRAVIR-LAMIVUD 600-50-300 MG PO TABS: 1.0000 | ORAL_TABLET | Freq: Every day | ORAL | Status: DC

## 2014-02-02 ENCOUNTER — Encounter: Payer: Self-pay | Admitting: Infectious Disease

## 2014-02-02 ENCOUNTER — Ambulatory Visit (INDEPENDENT_AMBULATORY_CARE_PROVIDER_SITE_OTHER): Payer: Medicaid Other | Admitting: Infectious Disease

## 2014-02-02 VITALS — BP 136/92 | HR 83 | Temp 97.3°F | Wt 143.0 lb

## 2014-02-02 DIAGNOSIS — K219 Gastro-esophageal reflux disease without esophagitis: Secondary | ICD-10-CM

## 2014-02-02 DIAGNOSIS — Z227 Latent tuberculosis: Secondary | ICD-10-CM

## 2014-02-02 DIAGNOSIS — B20 Human immunodeficiency virus [HIV] disease: Secondary | ICD-10-CM

## 2014-02-02 DIAGNOSIS — R7611 Nonspecific reaction to tuberculin skin test without active tuberculosis: Secondary | ICD-10-CM

## 2014-02-02 NOTE — Progress Notes (Signed)
  Subjective:    Patient ID: Samuel Bond, male    DOB: 1980-11-20, 33 y.o.   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 [facility] perfect virological suppression and healthy immune system with healthy CD4 count.  He is doing well although there is concern about whether his Medicaid will be renewed which I understand it will not at in May.   He was working with Samuel Bond to make sure he meets with Samuel Bond and gets enrolled into ADAP and he is indeed renewed thru September. occ he has HA which he thinks are due to changes in pollen and weather. Otherwise no problems  Review of Systems  Constitutional: Negative for fever, chills, diaphoresis, activity change, appetite change, fatigue and unexpected weight change.  HENT: Negative for congestion, rhinorrhea, sinus pressure, sneezing, sore throat and trouble swallowing.   Eyes: Negative for photophobia and visual disturbance.  Respiratory: Negative for cough, chest tightness, shortness of breath, wheezing and stridor.   Cardiovascular: Negative for chest pain, palpitations and leg swelling.  Gastrointestinal: Negative for nausea, vomiting, abdominal pain, diarrhea, constipation, blood in stool, abdominal distention and anal bleeding.  Genitourinary: Negative for dysuria, hematuria, flank pain and difficulty urinating.  Musculoskeletal: Negative for arthralgias, back pain, gait problem, joint swelling and myalgias.  Skin: Negative for color change, pallor, rash and wound.  Neurological: Negative for dizziness, tremors, weakness and light-headedness.  Hematological: Negative for adenopathy. Does not bruise/bleed easily.  Psychiatric/Behavioral: Negative for behavioral problems, confusion, sleep disturbance, dysphoric mood, decreased concentration and agitation.       Objective:   Physical Exam  Constitutional: He is oriented to person, place, and time. He  appears well-developed and well-nourished. No distress.  HENT:  Head: Normocephalic and atraumatic.  Mouth/Throat: Oropharynx is clear and moist. No oropharyngeal exudate.  Eyes: Conjunctivae and EOM are normal. Pupils are equal, round, and reactive to light. No scleral icterus.  Neck: Normal range of motion. Neck supple. No JVD present.  Cardiovascular: Normal rate, regular rhythm and normal heart sounds.  Exam reveals no gallop and no friction rub.   No murmur heard. Pulmonary/Chest: Effort normal and breath sounds normal. No respiratory distress. He has no wheezes. He has no rales. He exhibits no tenderness.  Abdominal: He exhibits no distension and no mass. There is no tenderness. There is no rebound and no guarding.  Musculoskeletal: He exhibits no edema and no tenderness.  Lymphadenopathy:    He has no cervical adenopathy.  Neurological: He is alert and oriented to person, place, and time. He has normal reflexes. He exhibits normal muscle tone. Coordination normal.  Skin: Skin is warm and dry. He is not diaphoretic. No erythema. No pallor.  Psychiatric: He has a normal mood and affect. His behavior is normal. Judgment and thought content normal.          Assessment & Plan:  HIV : Continue TRIUMEQenrolled into ADAP.  Bring in in July for ADAP renewal  RTC for labs and visit with me in 6 months time  GERD: None issue for him at this point time   HA: could be DTG side effect but doubt this and is minimal at present  LTB: need check if he completed therapy for this

## 2014-02-02 NOTE — Patient Instructions (Signed)
followup appt without labs in July to renew ADAP

## 2014-04-13 ENCOUNTER — Other Ambulatory Visit: Payer: Self-pay | Admitting: *Deleted

## 2014-04-13 DIAGNOSIS — B20 Human immunodeficiency virus [HIV] disease: Secondary | ICD-10-CM

## 2014-04-13 MED ORDER — ABACAVIR-DOLUTEGRAVIR-LAMIVUD 600-50-300 MG PO TABS
1.0000 | ORAL_TABLET | Freq: Every day | ORAL | Status: DC
Start: 2014-04-13 — End: 2014-09-09

## 2014-04-13 NOTE — Telephone Encounter (Signed)
ADAP Application 

## 2014-07-22 ENCOUNTER — Ambulatory Visit: Payer: Self-pay | Admitting: Infectious Disease

## 2014-08-05 ENCOUNTER — Other Ambulatory Visit: Payer: Self-pay

## 2014-08-24 ENCOUNTER — Ambulatory Visit: Payer: Self-pay | Admitting: Infectious Disease

## 2014-08-31 ENCOUNTER — Ambulatory Visit (INDEPENDENT_AMBULATORY_CARE_PROVIDER_SITE_OTHER): Payer: Self-pay | Admitting: Infectious Disease

## 2014-08-31 ENCOUNTER — Encounter: Payer: Self-pay | Admitting: Infectious Disease

## 2014-08-31 VITALS — BP 133/89 | HR 78 | Temp 98.0°F | Ht 66.0 in | Wt 148.0 lb

## 2014-08-31 DIAGNOSIS — Z113 Encounter for screening for infections with a predominantly sexual mode of transmission: Secondary | ICD-10-CM

## 2014-08-31 DIAGNOSIS — R7611 Nonspecific reaction to tuberculin skin test without active tuberculosis: Secondary | ICD-10-CM

## 2014-08-31 DIAGNOSIS — Z227 Latent tuberculosis: Secondary | ICD-10-CM

## 2014-08-31 DIAGNOSIS — B20 Human immunodeficiency virus [HIV] disease: Secondary | ICD-10-CM

## 2014-08-31 LAB — COMPLETE METABOLIC PANEL WITH GFR
ALK PHOS: 81 U/L (ref 39–117)
ALT: 9 U/L (ref 0–53)
AST: 18 U/L (ref 0–37)
Albumin: 4.5 g/dL (ref 3.5–5.2)
BILIRUBIN TOTAL: 0.5 mg/dL (ref 0.2–1.2)
BUN: 8 mg/dL (ref 6–23)
CO2: 25 meq/L (ref 19–32)
CREATININE: 0.91 mg/dL (ref 0.50–1.35)
Calcium: 9.7 mg/dL (ref 8.4–10.5)
Chloride: 105 mEq/L (ref 96–112)
GFR, Est African American: >89 mL/min
GLUCOSE: 101 mg/dL — AB (ref 70–99)
Potassium: 3.9 mEq/L (ref 3.5–5.3)
Sodium: 138 mEq/L (ref 135–145)
Total Protein: 7.6 g/dL (ref 6.0–8.3)

## 2014-08-31 LAB — CBC WITH DIFFERENTIAL/PLATELET
Basophils Absolute: 0.1 10*3/uL (ref 0.0–0.1)
Basophils Relative: 1 % (ref 0–1)
Eosinophils Absolute: 0.2 10*3/uL (ref 0.0–0.7)
Eosinophils Relative: 3 % (ref 0–5)
HEMATOCRIT: 43.9 % (ref 39.0–52.0)
HEMOGLOBIN: 15.1 g/dL (ref 13.0–17.0)
LYMPHS ABS: 1.5 10*3/uL (ref 0.7–4.0)
Lymphocytes Relative: 30 % (ref 12–46)
MCH: 30.3 pg (ref 26.0–34.0)
MCHC: 34.4 g/dL (ref 30.0–36.0)
MCV: 88.2 fL (ref 78.0–100.0)
MONOS PCT: 5 % (ref 3–12)
MPV: 9.3 fL — AB (ref 9.4–12.4)
Monocytes Absolute: 0.3 10*3/uL (ref 0.1–1.0)
NEUTROS PCT: 61 % (ref 43–77)
Neutro Abs: 3.1 10*3/uL (ref 1.7–7.7)
Platelets: 267 10*3/uL (ref 150–400)
RBC: 4.98 MIL/uL (ref 4.22–5.81)
RDW: 13.8 % (ref 11.5–15.5)
WBC: 5 10*3/uL (ref 4.0–10.5)

## 2014-08-31 LAB — RPR

## 2014-08-31 NOTE — Progress Notes (Signed)
  Subjective:    Patient ID: Samuel Bond, male    DOB: 1980-10-30, 33 y.o.   MRN: [id number]  HPI   33year-old Samuel Bond man with HIV and LTB whom I saw in June and started on Tivicay and Truvada. [facility] to [facility] perfect virological suppression and healthy immune system with healthy CD4 count.  I have [facility] him now to [facility] and he is tolerating it fine without side effect. He said he finished rx for LTB via health dept.    Review of Systems  Constitutional: Negative for fever, chills, diaphoresis, activity change, appetite change, fatigue and unexpected weight change.  HENT: Negative for congestion, rhinorrhea, sinus pressure, sneezing, sore throat and trouble swallowing.   Eyes: Negative for photophobia and visual disturbance.  Respiratory: Negative for cough, chest tightness, shortness of breath, wheezing and stridor.   Cardiovascular: Negative for chest pain, palpitations and leg swelling.  Gastrointestinal: Negative for nausea, vomiting, abdominal pain, diarrhea, constipation, blood in stool, abdominal distention and anal bleeding.  Genitourinary: Negative for dysuria, hematuria, flank pain and difficulty urinating.  Musculoskeletal: Negative for myalgias, back pain, joint swelling, arthralgias and gait problem.  Skin: Negative for color change, pallor, rash and wound.  Neurological: Negative for dizziness, tremors, weakness, light-headedness and headaches.  Hematological: Negative for adenopathy. Does not bruise/bleed easily.  Psychiatric/Behavioral: Negative for behavioral problems, confusion, sleep disturbance, dysphoric mood, decreased concentration and agitation.       Objective:   Physical Exam  Constitutional: He is oriented to person, place, and time. He appears well-developed and well-nourished.  HENT:  Head: Normocephalic and atraumatic.  Eyes: Conjunctivae and EOM are normal.  Neck: Normal range of motion. Neck supple.  Cardiovascular: Normal rate and  regular rhythm.   Pulmonary/Chest: Effort normal. No respiratory distress. He has no wheezes.  Abdominal: Soft. He exhibits no distension.  Musculoskeletal: Normal range of motion. He exhibits no edema or tenderness.  Neurological: He is alert and oriented to person, place, and time.  Skin: Skin is warm and dry. No rash noted. No erythema. No pallor.          Assessment & Plan:  HIV : Continue TRIUMEQ REenroll into ADAP in Feb and get labs today  GERD: None issue for him at this point time   LTB: he says he was rx, will send note to Lamar BenesLaura Bachman

## 2014-08-31 NOTE — Patient Instructions (Signed)
We need blood work today  You need to make an appt with our financial counselor in February to RENEW ADAP  I want you to also come back and see me in February

## 2014-09-01 LAB — HIV-1 RNA QUANT-NO REFLEX-BLD: HIV 1 RNA Quant: <20 copies/mL (ref <20)

## 2014-09-01 LAB — T-HELPER CELL (CD4) - (RCID CLINIC ONLY)
CD4 % Helper T Cell: 31 % — ABNORMAL LOW (ref 33–55)
CD4 T CELL ABS: 420 /uL (ref 400–2700)

## 2014-09-01 LAB — URINE CYTOLOGY ANCILLARY ONLY
Chlamydia: NEGATIVE
NEISSERIA GONORRHEA: NEGATIVE

## 2014-09-09 ENCOUNTER — Other Ambulatory Visit: Payer: Self-pay | Admitting: *Deleted

## 2014-09-09 DIAGNOSIS — B20 Human immunodeficiency virus [HIV] disease: Secondary | ICD-10-CM

## 2014-09-09 MED ORDER — ABACAVIR-DOLUTEGRAVIR-LAMIVUD 600-50-300 MG PO TABS: 1.0000 | ORAL_TABLET | Freq: Every day | ORAL | Status: DC

## 2014-11-04 ENCOUNTER — Ambulatory Visit (INDEPENDENT_AMBULATORY_CARE_PROVIDER_SITE_OTHER): Payer: Self-pay | Admitting: Infectious Disease

## 2014-11-04 ENCOUNTER — Encounter: Payer: Self-pay | Admitting: Infectious Disease

## 2014-11-04 VITALS — BP 132/88 | HR 73 | Temp 97.9°F | Wt 150.8 lb

## 2014-11-04 DIAGNOSIS — R7611 Nonspecific reaction to tuberculin skin test without active tuberculosis: Secondary | ICD-10-CM

## 2014-11-04 DIAGNOSIS — B20 Human immunodeficiency virus [HIV] disease: Secondary | ICD-10-CM

## 2014-11-04 DIAGNOSIS — Z227 Latent tuberculosis: Secondary | ICD-10-CM

## 2014-11-04 HISTORY — DX: Latent tuberculosis: Z22.7

## 2014-11-04 NOTE — Progress Notes (Signed)
  Subjective:    Patient ID: Samuel Bond, male    DOB: February 18, 1981, 34 y.o.   MRN: [id number]  HPI   34year-old [name] with HIV and LTB whom I saw in June and started on Tivicay and Truvada. [facility] perfect virological suppression and healthy immune system with healthy CD4 count.  I have switched him now to [facility] and he is tolerating it fine without side effect. He said he finished rx for LTB via health dept.  He needs to renew his ADAP today.  Lab Results  Component Value Date   HIV1RNAQUANT <20 08/31/2014   Lab Results  Component Value Date   CD4TABS 420 08/31/2014   CD4TABS 320* 11/03/2013   CD4TABS 530 08/11/2013       Review of Systems  Constitutional: Negative for fever, chills, diaphoresis, activity change, appetite change, fatigue and unexpected weight change.  HENT: Negative for congestion, rhinorrhea, sinus pressure, sneezing, sore throat and trouble swallowing.   Eyes: Negative for photophobia and visual disturbance.  Respiratory: Negative for cough, chest tightness, shortness of breath, wheezing and stridor.   Cardiovascular: Negative for chest pain, palpitations and leg swelling.  Gastrointestinal: Negative for nausea, vomiting, abdominal pain, diarrhea, constipation, blood in stool, abdominal distention and anal bleeding.  Genitourinary: Negative for dysuria, hematuria, flank pain and difficulty urinating.  Musculoskeletal: Negative for myalgias, back pain, joint swelling, arthralgias and gait problem.  Skin: Negative for color change, pallor, rash and wound.  Neurological: Negative for dizziness, tremors, weakness, light-headedness and headaches.  Hematological: Negative for adenopathy. Does not bruise/bleed easily.  Psychiatric/Behavioral: Negative for behavioral problems, confusion, sleep disturbance, dysphoric mood, decreased concentration and agitation.       Objective:   Physical Exam  Constitutional: He is oriented to person,  place, and time. He appears well-developed and well-nourished.  HENT:  Head: Normocephalic and atraumatic.  Eyes: Conjunctivae and EOM are normal.  Neck: Normal range of motion. Neck supple.  Cardiovascular: Normal rate and regular rhythm.   Pulmonary/Chest: Effort normal. No respiratory distress. He has no wheezes.  Abdominal: Soft. He exhibits no distension.  Musculoskeletal: Normal range of motion. He exhibits no edema or tenderness.  Neurological: He is alert and oriented to person, place, and time.  Skin: Skin is warm and dry. No rash noted. No erythema. No pallor.          Assessment & Plan:  HIV : Continue TRIUMEQ REenroll into ADAP now   LTB: he says he was rx, will send note to Lamar BenesLaura Bachman

## 2014-11-04 NOTE — Patient Instructions (Signed)
Make sure you see Samuel Bond for ADAP renewal

## 2014-11-11 ENCOUNTER — Other Ambulatory Visit: Payer: Self-pay | Admitting: *Deleted

## 2014-11-11 DIAGNOSIS — B20 Human immunodeficiency virus [HIV] disease: Secondary | ICD-10-CM

## 2014-11-11 MED ORDER — ABACAVIR-DOLUTEGRAVIR-LAMIVUD 600-50-300 MG PO TABS
1.0000 | ORAL_TABLET | Freq: Every day | ORAL | Status: DC
Start: 2014-11-11 — End: 2015-09-02

## 2014-11-11 NOTE — Telephone Encounter (Signed)
Walk-in to clinic due to "cold" symptoms for 2 days

## 2015-04-19 ENCOUNTER — Other Ambulatory Visit: Payer: Self-pay

## 2015-04-19 DIAGNOSIS — B20 Human immunodeficiency virus [HIV] disease: Secondary | ICD-10-CM

## 2015-04-19 LAB — CBC WITH DIFFERENTIAL/PLATELET
Basophils Absolute: 0.1 10*3/uL (ref 0.0–0.1)
Basophils Relative: 1 % (ref 0–1)
EOS PCT: 4 % (ref 0–5)
Eosinophils Absolute: 0.2 10*3/uL (ref 0.0–0.7)
HCT: 46.8 % (ref 39.0–52.0)
Hemoglobin: 16 g/dL (ref 13.0–17.0)
LYMPHS ABS: 1.3 10*3/uL (ref 0.7–4.0)
LYMPHS PCT: 24 % (ref 12–46)
MCH: 30.1 pg (ref 26.0–34.0)
MCHC: 34.2 g/dL (ref 30.0–36.0)
MCV: 88.1 fL (ref 78.0–100.0)
MPV: 8.8 fL (ref 8.6–12.4)
Monocytes Absolute: 0.3 10*3/uL (ref 0.1–1.0)
Monocytes Relative: 6 % (ref 3–12)
Neutro Abs: 3.6 10*3/uL (ref 1.7–7.7)
Neutrophils Relative %: 65 % (ref 43–77)
Platelets: 252 10*3/uL (ref 150–400)
RBC: 5.31 MIL/uL (ref 4.22–5.81)
RDW: 14 % (ref 11.5–15.5)
WBC: 5.6 10*3/uL (ref 4.0–10.5)

## 2015-04-19 LAB — COMPLETE METABOLIC PANEL WITH GFR
ALK PHOS: 68 U/L (ref 40–115)
ALT: 10 U/L (ref 9–46)
AST: 19 U/L (ref 10–40)
Albumin: 4.5 g/dL (ref 3.6–5.1)
BUN: 6 mg/dL — ABNORMAL LOW (ref 7–25)
CALCIUM: 9.5 mg/dL (ref 8.6–10.3)
CO2: 26 mmol/L (ref 20–31)
CREATININE: 0.86 mg/dL (ref 0.60–1.35)
Chloride: 106 mmol/L (ref 98–110)
GFR, Est African American: >89 mL/min (ref >=60)
Glucose, Bld: 97 mg/dL (ref 65–99)
Potassium: 4.6 mmol/L (ref 3.5–5.3)
SODIUM: 139 mmol/L (ref 135–146)
Total Bilirubin: 0.5 mg/dL (ref 0.2–1.2)
Total Protein: 7.5 g/dL (ref 6.1–8.1)

## 2015-04-19 LAB — LIPID PANEL
CHOLESTEROL: 153 mg/dL (ref 125–200)
HDL: 31 mg/dL — ABNORMAL LOW (ref >=40)
LDL Cholesterol: 87 mg/dL (ref <130)
Total CHOL/HDL Ratio: 4.9 Ratio (ref <=5.0)
Triglycerides: 173 mg/dL — ABNORMAL HIGH (ref <150)
VLDL: 35 mg/dL — ABNORMAL HIGH (ref <30)

## 2015-04-20 LAB — MICROALBUMIN / CREATININE URINE RATIO
CREATININE, URINE: 25.6 mg/dL
Microalb, Ur: <0.2 mg/dL (ref <2.0)

## 2015-04-20 LAB — URINE CYTOLOGY ANCILLARY ONLY
Chlamydia: NEGATIVE
NEISSERIA GONORRHEA: NEGATIVE

## 2015-04-20 LAB — HEPATITIS C ANTIBODY: HCV AB: NEGATIVE

## 2015-04-20 LAB — RPR

## 2015-04-21 LAB — HIV-1 RNA QUANT-NO REFLEX-BLD

## 2015-04-22 LAB — T-HELPER CELL (CD4) - (RCID CLINIC ONLY)
CD4 % Helper T Cell: 37 % (ref 33–55)
CD4 T Cell Abs: 500 /uL (ref 400–2700)

## 2015-05-05 ENCOUNTER — Ambulatory Visit: Payer: Self-pay | Admitting: Infectious Disease

## 2015-06-07 ENCOUNTER — Ambulatory Visit (INDEPENDENT_AMBULATORY_CARE_PROVIDER_SITE_OTHER): Payer: Self-pay | Admitting: Infectious Disease

## 2015-06-07 ENCOUNTER — Encounter: Payer: Self-pay | Admitting: Infectious Disease

## 2015-06-07 VITALS — BP 129/84 | HR 76 | Temp 98.0°F | Wt 147.0 lb

## 2015-06-07 DIAGNOSIS — B20 Human immunodeficiency virus [HIV] disease: Secondary | ICD-10-CM

## 2015-06-07 DIAGNOSIS — K219 Gastro-esophageal reflux disease without esophagitis: Secondary | ICD-10-CM

## 2015-06-07 DIAGNOSIS — Z23 Encounter for immunization: Secondary | ICD-10-CM

## 2015-06-07 NOTE — Progress Notes (Signed)
  Subjective:    Patient ID: Samuel Bond, male    DOB: [birth year], 34 y.o.   MRN: [id number]  HPI   34 year-old [place] man with HIV and LTB whom I saw in June and started on Tivicay and Truvada. changed over to [facility] perfect virological suppression and healthy immune system with healthy CD4 count.  I have switched him now to [facility] and he is tolerating it fine without side effect. He said he finished rx for LTB via health dept.     Lab Results  Component Value Date   HIV1RNAQUANT <20 04/19/2015   Lab Results  Component Value Date   CD4TABS 500 04/20/2015   CD4TABS 420 08/31/2014   CD4TABS 320* 11/03/2013   Past Medical History  Diagnosis Date  . HIV disease   . Maternal HIV infection during pregnancy   . GERD (gastroesophageal reflux disease)   . 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 None     Family History  Problem Relation Age of Onset  . Asthma Father    Social History  Substance Use Topics  . Smoking status: Never Smoker   . Smokeless tobacco: None  . Alcohol Use: No   No Known Allergies   Current outpatient prescriptions:  .  Abacavir-Dolutegravir-Lamivud (TRIUMEQ) 600-50-300 MG TABS, Take 1 tablet by mouth daily., Disp: 30 tablet, Rfl: 5     Review of Systems  Constitutional: Negative for fever, chills, diaphoresis, activity change, appetite change, fatigue and unexpected weight change.  HENT: Negative for congestion, rhinorrhea, sinus pressure, sneezing, sore throat and trouble swallowing.   Eyes: Negative for photophobia and visual disturbance.  Respiratory: Negative for cough, chest tightness, shortness of breath, wheezing and stridor.   Cardiovascular: Negative for chest pain, palpitations and leg swelling.  Gastrointestinal: Negative for nausea, vomiting, abdominal pain, diarrhea, constipation, blood in stool, abdominal distention and anal bleeding.  Genitourinary: Negative for dysuria, hematuria,  flank pain and difficulty urinating.  Musculoskeletal: Negative for myalgias, back pain, joint swelling, arthralgias and gait problem.  Skin: Negative for color change, pallor, rash and wound.  Neurological: Negative for dizziness, tremors, weakness, light-headedness and headaches.  Hematological: Negative for adenopathy. Does not bruise/bleed easily.  Psychiatric/Behavioral: Negative for behavioral problems, confusion, sleep disturbance, dysphoric mood, decreased concentration and agitation.       Objective:   Physical Exam  Constitutional: He is oriented to person, place, and time. He appears well-developed and well-nourished.  HENT:  Head: Normocephalic and atraumatic.  Eyes: Conjunctivae and EOM are normal.  Neck: Normal range of motion. Neck supple.  Cardiovascular: Normal rate and regular rhythm.   Pulmonary/Chest: Effort normal. No respiratory distress. He has no wheezes.  Abdominal: Soft. He exhibits no distension.  Musculoskeletal: Normal range of motion. He exhibits no edema or tenderness.  Neurological: He is alert and oriented to person, place, and time.  Skin: Skin is warm and dry. No rash noted. No erythema. No pallor.          Assessment & Plan:  HIV : Continue TRIUMEQ and make sure vaccines up to date I spent greater than 15 minutes with the patient including greater than 50% of time in face to face counsel of the patient and in coordination of their care.

## 2015-09-02 ENCOUNTER — Other Ambulatory Visit: Payer: Self-pay | Admitting: Infectious Disease

## 2015-09-02 DIAGNOSIS — B20 Human immunodeficiency virus [HIV] disease: Secondary | ICD-10-CM

## 2015-10-06 ENCOUNTER — Other Ambulatory Visit (INDEPENDENT_AMBULATORY_CARE_PROVIDER_SITE_OTHER): Payer: Self-pay

## 2015-10-06 DIAGNOSIS — Z79899 Other long term (current) drug therapy: Secondary | ICD-10-CM

## 2015-10-06 DIAGNOSIS — Z113 Encounter for screening for infections with a predominantly sexual mode of transmission: Secondary | ICD-10-CM

## 2015-10-06 DIAGNOSIS — B20 Human immunodeficiency virus [HIV] disease: Secondary | ICD-10-CM

## 2015-10-06 LAB — CBC WITH DIFFERENTIAL/PLATELET
BASOS ABS: 0 10*3/uL (ref 0.0–0.1)
BASOS PCT: 0 % (ref 0–1)
EOS ABS: 0.1 10*3/uL (ref 0.0–0.7)
Eosinophils Relative: 2 % (ref 0–5)
HCT: 45.5 % (ref 39.0–52.0)
HEMOGLOBIN: 15.4 g/dL (ref 13.0–17.0)
LYMPHS ABS: 1.6 10*3/uL (ref 0.7–4.0)
Lymphocytes Relative: 31 % (ref 12–46)
MCH: 30.2 pg (ref 26.0–34.0)
MCHC: 33.8 g/dL (ref 30.0–36.0)
MCV: 89.2 fL (ref 78.0–100.0)
MONOS PCT: 6 % (ref 3–12)
MPV: 9.5 fL (ref 8.6–12.4)
Monocytes Absolute: 0.3 10*3/uL (ref 0.1–1.0)
NEUTROS ABS: 3.2 10*3/uL (ref 1.7–7.7)
NEUTROS PCT: 61 % (ref 43–77)
PLATELETS: 280 10*3/uL (ref 150–400)
RBC: 5.1 MIL/uL (ref 4.22–5.81)
RDW: 14 % (ref 11.5–15.5)
WBC: 5.3 10*3/uL (ref 4.0–10.5)

## 2015-10-06 LAB — LIPID PANEL
CHOL/HDL RATIO: 4.5 ratio (ref <=5.0)
Cholesterol: 139 mg/dL (ref 125–200)
HDL: 31 mg/dL — ABNORMAL LOW (ref >=40)
LDL Cholesterol: 80 mg/dL (ref <130)
Triglycerides: 140 mg/dL (ref <150)
VLDL: 28 mg/dL (ref <30)

## 2015-10-06 LAB — COMPLETE METABOLIC PANEL WITH GFR
ALBUMIN: 4.5 g/dL (ref 3.6–5.1)
ALK PHOS: 72 U/L (ref 40–115)
ALT: 10 U/L (ref 9–46)
AST: 18 U/L (ref 10–40)
BILIRUBIN TOTAL: 0.6 mg/dL (ref 0.2–1.2)
BUN: 4 mg/dL — AB (ref 7–25)
CO2: 25 mmol/L (ref 20–31)
CREATININE: 0.92 mg/dL (ref 0.60–1.35)
Calcium: 9.7 mg/dL (ref 8.6–10.3)
Chloride: 103 mmol/L (ref 98–110)
GFR, Est African American: >89 mL/min (ref >=60)
GFR, Est Non African American: >89 mL/min (ref >=60)
GLUCOSE: 105 mg/dL — AB (ref 65–99)
Potassium: 4.1 mmol/L (ref 3.5–5.3)
SODIUM: 140 mmol/L (ref 135–146)
TOTAL PROTEIN: 7.6 g/dL (ref 6.1–8.1)

## 2015-10-06 NOTE — Addendum Note (Signed)
Addended by: Mariea Clonts D on: 10/06/2015 12:04 PM   Modules accepted: Orders

## 2015-10-07 LAB — URINE CYTOLOGY ANCILLARY ONLY
Chlamydia: NEGATIVE
NEISSERIA GONORRHEA: NEGATIVE

## 2015-10-07 LAB — RPR

## 2015-10-07 NOTE — Addendum Note (Signed)
Addended by: Mariea Clonts D on: 10/07/2015 12:13 PM   Modules accepted: Orders

## 2015-10-08 LAB — HIV-1 RNA QUANT-NO REFLEX-BLD

## 2015-10-08 LAB — T-HELPER CELL (CD4) - (RCID CLINIC ONLY)
CD4 T CELL ABS: 470 /uL (ref 400–2700)
CD4 T CELL HELPER: 29 % — AB (ref 33–55)

## 2015-10-13 ENCOUNTER — Ambulatory Visit (INDEPENDENT_AMBULATORY_CARE_PROVIDER_SITE_OTHER): Payer: Self-pay | Admitting: Infectious Disease

## 2015-10-13 ENCOUNTER — Encounter: Payer: Self-pay | Admitting: Infectious Disease

## 2015-10-13 VITALS — BP 134/82 | HR 81 | Temp 98.1°F | Wt 146.0 lb

## 2015-10-13 DIAGNOSIS — B20 Human immunodeficiency virus [HIV] disease: Secondary | ICD-10-CM

## 2015-10-13 NOTE — Progress Notes (Signed)
Chief complaint: Follow for HIV on medications Subjective:    Patient ID: Samuel Bond, male    DOB: 28-Apr-1981, 35 y.o.   MRN: [id number]  HPI   35 year-old [place] man with HIV and LTB whom I saw in June and started on Tivicay and Truvada. changed over to [facility] perfect virological suppression and healthy immune system with healthy CD4 count.  He has absolute no complaints today.    Lab Results  Component Value Date   HIV1RNAQUANT <20 10/06/2015   Lab Results  Component Value Date   CD4TABS 470 10/07/2015   CD4TABS 500 04/20/2015   CD4TABS 420 08/31/2014   Past Medical History  Diagnosis Date  . HIV disease (HCC)   . Maternal HIV infection during pregnancy   . GERD (gastroesophageal reflux disease)   . 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 None     Family History  Problem Relation Age of Onset  . Asthma Father    Social History  Substance Use Topics  . Smoking status: Never Smoker   . Smokeless tobacco: Never Used  . Alcohol Use: No   No Known Allergies   Current outpatient prescriptions:  .  TRIUMEQ 600-50-300 MG tablet, TAKE 1 TABLET BY MOUTH DAILY, Disp: 30 tablet, Rfl: 2     Review of Systems  Constitutional: Negative for fever, chills, diaphoresis, activity change, appetite change, fatigue and unexpected weight change.  HENT: Negative for congestion, rhinorrhea, sinus pressure, sneezing, sore throat and trouble swallowing.   Eyes: Negative for photophobia and visual disturbance.  Respiratory: Negative for cough, chest tightness, shortness of breath, wheezing and stridor.   Cardiovascular: Negative for chest pain, palpitations and leg swelling.  Gastrointestinal: Negative for nausea, vomiting, abdominal pain, diarrhea, constipation, blood in stool, abdominal distention and anal bleeding.  Genitourinary: Negative for dysuria, hematuria, flank pain and difficulty urinating.  Musculoskeletal: Negative for  myalgias, back pain, joint swelling, arthralgias and gait problem.  Skin: Negative for color change, pallor, rash and wound.  Neurological: Negative for dizziness, tremors, weakness, light-headedness and headaches.  Hematological: Negative for adenopathy. Does not bruise/bleed easily.  Psychiatric/Behavioral: Negative for behavioral problems, confusion, sleep disturbance, dysphoric mood, decreased concentration and agitation.       Objective:   Physical Exam  Constitutional: He is oriented to person, place, and time. He appears well-developed and well-nourished.  HENT:  Head: Normocephalic and atraumatic.  Eyes: Conjunctivae and EOM are normal.  Neck: Normal range of motion. Neck supple.  Cardiovascular: Normal rate and regular rhythm.   Pulmonary/Chest: Effort normal. No respiratory distress. He has no wheezes.  Abdominal: Soft. He exhibits no distension.  Musculoskeletal: Normal range of motion. He exhibits no edema or tenderness.  Neurological: He is alert and oriented to person, place, and time.  Skin: Skin is warm and dry. No rash noted. No erythema. No pallor.          Assessment & Plan:  HIV : Continue TRIUMEQ and renew ADAP

## 2015-12-02 ENCOUNTER — Other Ambulatory Visit: Payer: Self-pay | Admitting: Infectious Disease

## 2016-04-04 ENCOUNTER — Other Ambulatory Visit: Payer: Self-pay | Admitting: Infectious Disease

## 2016-04-12 ENCOUNTER — Encounter: Payer: Self-pay | Admitting: Infectious Disease

## 2016-05-02 ENCOUNTER — Other Ambulatory Visit (INDEPENDENT_AMBULATORY_CARE_PROVIDER_SITE_OTHER): Payer: Self-pay

## 2016-05-02 DIAGNOSIS — Z113 Encounter for screening for infections with a predominantly sexual mode of transmission: Secondary | ICD-10-CM

## 2016-05-02 DIAGNOSIS — B20 Human immunodeficiency virus [HIV] disease: Secondary | ICD-10-CM

## 2016-05-02 DIAGNOSIS — Z79899 Other long term (current) drug therapy: Secondary | ICD-10-CM

## 2016-05-02 LAB — CBC WITH DIFFERENTIAL/PLATELET
BASOS PCT: 1 %
Basophils Absolute: 65 cells/uL (ref 0–200)
EOS ABS: 130 {cells}/uL (ref 15–500)
Eosinophils Relative: 2 %
HCT: 42.2 % (ref 38.5–50.0)
HEMOGLOBIN: 14.9 g/dL (ref 13.2–17.1)
LYMPHS PCT: 32 %
Lymphs Abs: 2080 cells/uL (ref 850–3900)
MCH: 30.5 pg (ref 27.0–33.0)
MCHC: 35.3 g/dL (ref 32.0–36.0)
MCV: 86.3 fL (ref 80.0–100.0)
MONO ABS: 390 {cells}/uL (ref 200–950)
MONOS PCT: 6 %
MPV: 8.4 fL (ref 7.5–12.5)
NEUTROS ABS: 3835 {cells}/uL (ref 1500–7800)
Neutrophils Relative %: 59 %
PLATELETS: 267 10*3/uL (ref 140–400)
RBC: 4.89 MIL/uL (ref 4.20–5.80)
RDW: 13.7 % (ref 11.0–15.0)
WBC: 6.5 10*3/uL (ref 3.8–10.8)

## 2016-05-03 LAB — LIPID PANEL
Cholesterol: 142 mg/dL (ref 125–200)
HDL: 38 mg/dL — ABNORMAL LOW (ref >=40)
LDL CALC: 82 mg/dL (ref <130)
Total CHOL/HDL Ratio: 3.7 Ratio (ref <=5.0)
Triglycerides: 111 mg/dL (ref <150)
VLDL: 22 mg/dL (ref <30)

## 2016-05-03 LAB — COMPLETE METABOLIC PANEL WITH GFR
ALBUMIN: 4.5 g/dL (ref 3.6–5.1)
ALK PHOS: 72 U/L (ref 40–115)
ALT: 7 U/L — AB (ref 9–46)
AST: 19 U/L (ref 10–40)
BILIRUBIN TOTAL: 0.5 mg/dL (ref 0.2–1.2)
BUN: 10 mg/dL (ref 7–25)
CO2: 23 mmol/L (ref 20–31)
CREATININE: 0.88 mg/dL (ref 0.60–1.35)
Calcium: 9.1 mg/dL (ref 8.6–10.3)
Chloride: 105 mmol/L (ref 98–110)
GFR, Est African American: >89 mL/min (ref >=60)
GFR, Est Non African American: >89 mL/min (ref >=60)
GLUCOSE: 104 mg/dL — AB (ref 65–99)
Potassium: 4.1 mmol/L (ref 3.5–5.3)
SODIUM: 139 mmol/L (ref 135–146)
TOTAL PROTEIN: 7.4 g/dL (ref 6.1–8.1)

## 2016-05-03 LAB — RPR

## 2016-05-03 LAB — HIV-1 RNA QUANT-NO REFLEX-BLD
HIV 1 RNA Quant: <20 copies/mL (ref <20)
HIV-1 RNA Quant, Log: <1.3 Log copies/mL (ref <1.30)

## 2016-05-03 LAB — T-HELPER CELL (CD4) - (RCID CLINIC ONLY)
CD4 % Helper T Cell: 25 % — ABNORMAL LOW (ref 33–55)
CD4 T CELL ABS: 500 /uL (ref 400–2700)

## 2016-05-03 LAB — URINE CYTOLOGY ANCILLARY ONLY
CHLAMYDIA, DNA PROBE: NEGATIVE
NEISSERIA GONORRHEA: NEGATIVE

## 2016-05-16 ENCOUNTER — Ambulatory Visit (INDEPENDENT_AMBULATORY_CARE_PROVIDER_SITE_OTHER): Payer: Self-pay | Admitting: Infectious Disease

## 2016-05-16 ENCOUNTER — Encounter: Payer: Self-pay | Admitting: Infectious Disease

## 2016-05-16 VITALS — BP 130/88 | HR 80 | Temp 98.3°F | Wt 141.0 lb

## 2016-05-16 DIAGNOSIS — Z21 Asymptomatic human immunodeficiency virus [HIV] infection status: Secondary | ICD-10-CM

## 2016-05-16 DIAGNOSIS — Z23 Encounter for immunization: Secondary | ICD-10-CM

## 2016-05-16 DIAGNOSIS — B20 Human immunodeficiency virus [HIV] disease: Secondary | ICD-10-CM

## 2016-05-16 NOTE — Progress Notes (Signed)
Chief complaint: Follow for HIV on medications Subjective:    Patient ID: Samuel Bond, male    DOB: 1981-05-16, 35 y.o.   MRN: [id number]  HPI  35 year-old [name] with HIV and LTB whom I saw in June and started on Tivicay and Truvada. [facility] perfect virological suppression and healthy immune system with healthy CD4 count.  He is due for flu shot today   Lab Results  Component Value Date   HIV1RNAQUANT <20 05/02/2016   HIV1RNAQUANT <20 10/06/2015   HIV1RNAQUANT <20 04/19/2015      Lab Results  Component Value Date   CD4TABS 500 05/02/2016   CD4TABS 470 10/07/2015   CD4TABS 500 04/20/2015   Past Medical History:  Diagnosis Date  . GERD (gastroesophageal reflux disease)   . HIV disease (HCC)   . Maternal HIV infection during pregnancy   . 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 none     Family History  Problem Relation Age of Onset  . Asthma Father    Social History  Substance Use Topics  . Smoking status: Never Smoker  . Smokeless tobacco: Never Used  . Alcohol use No   No Known Allergies   Current Outpatient Prescriptions:  .  TRIUMEQ 600-50-300 MG tablet, TAKE 1 TABLET BY MOUTH DAILY, Disp: 30 tablet, Rfl: 5     Review of Systems  Constitutional: Negative for activity change, appetite change, chills, diaphoresis, fatigue, fever and unexpected weight change.  HENT: Negative for congestion, rhinorrhea, sinus pressure, sneezing, sore throat and trouble swallowing.   Eyes: Negative for photophobia and visual disturbance.  Respiratory: Negative for cough, chest tightness, shortness of breath, wheezing and stridor.   Cardiovascular: Negative for chest pain, palpitations and leg swelling.  Gastrointestinal: Negative for abdominal distention, abdominal pain, anal bleeding, blood in stool, constipation, diarrhea, nausea and vomiting.  Genitourinary: Negative for difficulty urinating, dysuria, flank  pain and hematuria.  Musculoskeletal: Negative for arthralgias, back pain, gait problem, joint swelling and myalgias.  Skin: Negative for color change, pallor, rash and wound.  Neurological: Negative for dizziness, tremors, weakness, light-headedness and headaches.  Hematological: Negative for adenopathy. Does not bruise/bleed easily.  Psychiatric/Behavioral: Negative for agitation, behavioral problems, confusion, decreased concentration, dysphoric mood and sleep disturbance.       Objective:   Physical Exam  Constitutional: He is oriented to person, place, and time. He appears well-developed and well-nourished.  HENT:  Head: Normocephalic and atraumatic.  Eyes: Conjunctivae and EOM are normal.  Neck: Normal range of motion. Neck supple.  Cardiovascular: Normal rate and regular rhythm.   Pulmonary/Chest: Effort normal. No respiratory distress. He has no wheezes.  Abdominal: Soft. He exhibits no distension.  Musculoskeletal: Normal range of motion. He exhibits no edema or tenderness.  Neurological: He is alert and oriented to person, place, and time.  Skin: Skin is warm and dry. No rash noted. No erythema. No pallor.          Assessment & Plan:  HIV : Continue TRIUMEQ and renew ADAP

## 2016-05-16 NOTE — Progress Notes (Signed)
Pt requested130/88

## 2016-07-10 ENCOUNTER — Emergency Department (HOSPITAL_COMMUNITY): Payer: BLUE CROSS/BLUE SHIELD

## 2016-07-10 ENCOUNTER — Encounter (HOSPITAL_COMMUNITY): Payer: Self-pay

## 2016-07-10 ENCOUNTER — Emergency Department (HOSPITAL_COMMUNITY)
Admission: EM | Admit: 2016-07-10 | Discharge: 2016-07-10 | Disposition: A | Discharge status: Home / Self Care | Payer: BLUE CROSS/BLUE SHIELD | Attending: Emergency Medicine | Admitting: Emergency Medicine

## 2016-07-10 DIAGNOSIS — J069 Acute upper respiratory infection, unspecified: Secondary | ICD-10-CM | POA: Insufficient documentation

## 2016-07-10 DIAGNOSIS — R0981 Nasal congestion: Secondary | ICD-10-CM | POA: Diagnosis present

## 2016-07-10 MED ORDER — FLUTICASONE PROPIONATE 50 MCG/ACT NA SUSP: 2.0000 | Freq: Every day | NASAL | 0 refills | Status: DC

## 2016-07-10 MED ORDER — LORATADINE 10 MG PO TABS
10.0000 mg | ORAL_TABLET | Freq: Once | ORAL | Status: AC
  Administered 2016-07-10: 10 mg via ORAL
  Filled 2016-07-10: qty 1

## 2016-07-10 NOTE — ED Notes (Signed)
Patient transported to X-ray 

## 2016-07-10 NOTE — ED Provider Notes (Signed)
[facility]-EMERGENCY DEPT Provider Note   CSN: [id number] Arrival date & time: 07/10/16  1502  By signing my name below, I, Phillis Haggis, attest that this documentation has been prepared under the direction and in the presence of Arvilla Meres, PA-C. Electronically Signed: Phillis Haggis, ED Scribe. 07/10/16. 4:44 PM.  History   Chief Complaint Chief Complaint  Patient presents with  . Nasal Congestion   The history is provided by the patient. No language interpreter was used.   HPI Comments: Samuel Bond is a 35 y.o. Male with a hx of latent TB and HIV who presents to the Emergency Department complaining of gradually worsening nasal congestion onset 2 days ago. Pt reports associated intermittent cough, rhinorrhea, watery eye discharge, and sinus pressure. Pt says that he has had intermittent "sinus problems" over the past few years, but has been constant this week. He notes that he typically gets flare-ups around this time of year. He has not taken anything for his symptoms. Pt is on anti-viral medication and says that his last CD4 count was 500. Pt had latent TB one year ago that was treated with 4 months of medication. He denies fever, chills, ear pain, sore throat, photophobia, eye pain, neck pain, SOB, chest pain, headache, myalgias, hemopytsis. Pt has an appointment with his PCP on 07/14/16.   Past Medical History:  Diagnosis Date  . GERD (gastroesophageal reflux disease)   . HIV disease (HCC)   . Maternal HIV infection during pregnancy   . 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 HIV disease (HCC) 02/19/2013    Past Surgical History:  Procedure Laterality Date  . none     Home Medications    Prior to Admission medications   Medication Sig Start Date End Date Taking? Authorizing Provider  fluticasone (FLONASE) 50 MCG/ACT nasal spray Place 2 sprays into both nostrils daily. 07/10/16   Lona Kettle, PA-C  TRIUMEQ 600-50-300  MG tablet TAKE 1 TABLET BY MOUTH DAILY 04/04/16   Randall Hiss, MD    Family History Family History  Problem Relation Age of Onset  . Asthma Father     Social History Social History  Substance Use Topics  . Smoking status: Never Smoker  . Smokeless tobacco: Never Used  . Alcohol use No     Allergies   Review of patient's allergies indicates no known allergies.   Review of Systems Review of Systems  Constitutional: Negative for chills and fever.  HENT: Positive for congestion, rhinorrhea and sinus pressure. Negative for ear pain and sore throat.   Eyes: Positive for discharge. Negative for photophobia, pain and visual disturbance.  Respiratory: Positive for cough. Negative for shortness of breath.   Cardiovascular: Negative for chest pain.  Musculoskeletal: Negative for myalgias and neck pain.  Neurological: Negative for headaches.   Physical Exam Updated Vital Signs BP 122/93 (BP Location: Right Arm)   Pulse 72   Temp 98 F (36.7 C) (Oral)   Resp 18   Ht 5\' 6"  (1.676 m)   Wt 65.8 kg   SpO2 98%   BMI 23.40 kg/m   Physical Exam  Constitutional: He is oriented to person, place, and time. He appears well-developed and well-nourished.  HENT:  Head: Normocephalic and atraumatic.  Right Ear: Tympanic membrane, external ear and ear canal normal.  Left Ear: Tympanic membrane, external ear and ear canal normal.  Nose: Mucosal edema present. Right sinus exhibits no maxillary  sinus tenderness and no frontal sinus tenderness. Left sinus exhibits no maxillary sinus tenderness and no frontal sinus tenderness.  Mouth/Throat: Uvula is midline, oropharynx is clear and moist and mucous membranes are normal. No trismus in the jaw.  Swelling of the left nasal turbinates. No TTP of sinuses. No trismus. Uvula midline. No oropharyngeal swelling. Managing oral secretions.   Eyes: Conjunctivae and EOM are normal. Pupils are equal, round, and reactive to light. Right eye exhibits no  discharge. Left eye exhibits no discharge. No scleral icterus.  Neck: Normal range of motion. Neck supple. No neck rigidity. Normal range of motion present.  No nuchal rigidity.   Cardiovascular: Normal rate, regular rhythm, normal heart sounds and intact distal pulses.   No murmur heard. Pulmonary/Chest: Effort normal and breath sounds normal. No respiratory distress. He has no wheezes. He has no rales.  Abdominal: Soft. There is no tenderness.  Musculoskeletal: Normal range of motion.  Neurological: He is alert and oriented to person, place, and time.  Skin: Skin is warm and dry.  Psychiatric: He has a normal mood and affect. His behavior is normal.  Nursing note and vitals reviewed.  ED Treatments / Results  DIAGNOSTIC STUDIES: Oxygen Saturation is 96% on RA, normal by my interpretation.    COORDINATION OF CARE: 4:42 PM-Discussed treatment plan which includes chest x-ray with pt at bedside and pt agreed to plan.    Labs (all labs ordered are listed, but only abnormal results are displayed) Labs Reviewed - No data to display  EKG  EKG Interpretation None       Radiology Dg Chest 2 View  Result Date: 07/10/2016 CLINICAL DATA:  Cough with mucus secretions. EXAM: CHEST  2 VIEW COMPARISON:  09/15/2013. FINDINGS: Trachea is midline. Heart size normal. Lungs are clear. No pleural fluid. IMPRESSION: No acute findings. Electronically Signed   By: Leanna BattlesMelinda  Blietz M.D.   On: 07/10/2016 18:12    Procedures Procedures (including critical care time)  Medications Ordered in ED Medications  loratadine (CLARITIN) tablet 10 mg (10 mg Oral Given 07/10/16 1730)     Initial Impression / Assessment and Plan / ED Course  I have reviewed the triage vital signs and the nursing notes.  Pertinent labs & imaging results that were available during my care of the patient were reviewed by me and considered in my medical decision making (see chart for details).  Clinical Course  Value  Comment By Time  DG Chest 2 View Normal cardiac silhouette. No evidence of consolidation, effusion, or PTX. No free air under diaphragm. Lona Kettleshley Laurel Letitia Sabala, [place]PA-C 10/23 1820    Patient presents to ED with complaint of nasal congestion and sinus pressure x 2 days. Patient is afebrile and non-toxic appearing in NAD. VSS. Mild left nasal turbinate swelling. No TTP of sinuses. Ears are normal appearing. No trismus. Uvula midline. Managing oral secretions. No nuchal rigidity. Given h/o HIV and latent TB with intermittent non-productive cough will get CXR.   Pt CXR negative for acute infiltrate. Patients symptoms are consistent with URI, likely viral etiology. Patient is currently on antiretroviral therapy and followed by ID, last CD4 counts in Aug 2017 were 500. Low suspicion for opportunistic infection. Discussed that antibiotics are not indicated for viral infections. Pt will be discharged with symptomatic treatment. Rx flonase. Patient to follow up with PCP on Friday. Return precautions given. Verbalizes understanding and is agreeable with plan. Pt is hemodynamically stable & in NAD prior to dc.   Final Clinical Impressions(s) /  ED Diagnoses   Final diagnoses:  Upper respiratory tract infection, unspecified type    I personally performed the services described in this documentation, which was scribed in my presence. The recorded information has been reviewed and is accurate.   New Prescriptions Discharge Medication List as of 07/10/2016  6:15 PM    START taking these medications   Details  fluticasone (FLONASE) 50 MCG/ACT nasal spray Place 2 sprays into both nostrils daily., Starting Mon 07/10/2016, Print         [place], PA-C 07/10/16 [place]    Alvira Monday, MD 07/11/16 2126

## 2016-07-10 NOTE — ED Triage Notes (Signed)
Pt reports nasal congestion X2 days. He reports some intermittent cough. Denies sore throat. Resp e/u.

## 2016-07-10 NOTE — Discharge Instructions (Signed)
Read the information below.  Your chest x-ray was unremarkable. I suspect you may have a viral upper respiratory infection. With the onset of symptoms 2-3 days ago, antibiotics are not indicated at this time.  I have prescribed flonase for relief of nasal congestion. Please take as directed.  You can take over-the-counter zyrtec and over-the-counter sudafed for further relief of nasal symptoms.  Be sure to keep your follow up appointment with your primary provider on Friday for re-evaluation.  Use the prescribed medication as directed.  Please discuss all new medications with your pharmacist.   You may return to the Emergency Department at any time for worsening condition or any new symptoms that concern you. Return to ED if you develop fever, neck pain, blood in sputum, difficulty breathing, chest pain, or any other new/concerning symptoms.

## 2016-07-10 NOTE — ED Notes (Signed)
Pt returned from xray

## 2016-07-14 ENCOUNTER — Ambulatory Visit: Payer: BLUE CROSS/BLUE SHIELD | Attending: Internal Medicine | Admitting: Physician Assistant

## 2016-07-14 ENCOUNTER — Encounter: Payer: Self-pay | Admitting: Physician Assistant

## 2016-07-14 VITALS — BP 138/87 | HR 63 | Temp 98.3°F | Resp 16 | Wt 145.8 lb

## 2016-07-14 DIAGNOSIS — A159 Respiratory tuberculosis unspecified: Secondary | ICD-10-CM | POA: Insufficient documentation

## 2016-07-14 DIAGNOSIS — B20 Human immunodeficiency virus [HIV] disease: Secondary | ICD-10-CM | POA: Diagnosis not present

## 2016-07-14 DIAGNOSIS — K219 Gastro-esophageal reflux disease without esophagitis: Secondary | ICD-10-CM | POA: Insufficient documentation

## 2016-07-14 DIAGNOSIS — J209 Acute bronchitis, unspecified: Secondary | ICD-10-CM | POA: Diagnosis not present

## 2016-07-14 DIAGNOSIS — R05 Cough: Secondary | ICD-10-CM | POA: Insufficient documentation

## 2016-07-14 MED ORDER — AZITHROMYCIN 250 MG PO TABS: ORAL_TABLET | ORAL | 0 refills | Status: DC

## 2016-07-14 MED ORDER — BENZONATATE 200 MG PO CAPS: 200.0000 mg | ORAL_CAPSULE | Freq: Two times a day (BID) | ORAL | 0 refills | Status: DC | PRN

## 2016-07-14 NOTE — Progress Notes (Signed)
Pt is in the office today for a ED follow up Pt states he has a little headache Pt states he has little cough

## 2016-07-14 NOTE — Progress Notes (Signed)
Samuel Bond, is a 35 y.o. male  ZOX:[id number]:[id number]  [id number]:[id number]  DOB - 12-29-1980  Subjective:  Chief Complaint and HPI: Samuel Bond is a 35 y.o. male here today for cough and for a follow up visit after being seen in the ED on 10/23/207 for cough and nasal congestion.  Patient is HIV + and has a history of latent TB that was treated for 4 months about 1 year ago.  A CXR was done in the ED on 10.23 and did not reveal any active disease.  Last CD4 was 04/2016=500.   Today he says his nasal congestion is improving but his cough is unchanged.  He has not had fever or weight loss.  Cough is non-productive.  No hemoptysis  ED/Hospital notes reviewed.     ROS:   Constitutional:  No f/c, No night sweats, No unexplained weight loss. EENT:  No vision changes, No blurry vision, No hearing changes. No mouth, throat, or ear problems.  +nasal congestion Respiratory: No cough, + SOB Cardiac: No CP, no palpitations GI:  No abd pain, No N/V/D. GU: No Urinary s/sx Musculoskeletal: No joint pain Neuro: No headache, no dizziness, no motor weakness.  Skin: No rash Endocrine:  No polydipsia. No polyuria.  Psych: Denies SI/HI  No problems updated.  ALLERGIES: No Known Allergies  PAST MEDICAL HISTORY: Past Medical History:  Diagnosis Date  . GERD (gastroesophageal reflux disease)   . HIV disease (HCC)   . Maternal HIV infection during pregnancy   . 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 Authorizing Provider  fluticasone (FLONASE) 50 MCG/ACT nasal spray Place 2 sprays into both nostrils daily. 07/10/16  Yes Lona KettleAshley Laurel Meyer, PA-C  TRIUMEQ 600-50-300 MG tablet TAKE 1 TABLET BY MOUTH DAILY 04/04/16  Yes Randall Hissornelius N [place], MD  azithromycin [facility](ZITHROMAX) 250 MG tablet Take 2 today, then 1 tablet days 2-5 07/14/16   Anders SimmondsAngela M McClung, PA-C  benzonatate (TESSALON) 200 MG capsule Take 1 capsule (200 mg total) by mouth  2 (two) times daily as needed for cough. 07/14/16   Anders SimmondsAngela M McClung, PA-C     Objective:  EXAM:   Vitals:   07/14/16 0935  BP: 138/87  Pulse: 63  Resp: 16  Temp: 98.3 F (36.8 C)  TempSrc: Oral  SpO2: 97%  Weight: 145 lb 12.8 oz (66.1 kg)    General appearance : A&OX3. NAD. Non-toxic-appearing HEENT: Atraumatic and Normocephalic.  PERRLA. EOM intact.  TM clear B. Mouth-MMM, post pharynx WNL w/o erythema, No PND. Neck: supple, no JVD. No cervical lymphadenopathy. No thyromegaly Chest/Lungs:  Breathing-non-labored, Good air entry bilaterally, breath sounds normal without rales, rhonchi, or wheezing  CVS: S1 S2 regular, no murmurs, gallops, rubs  Abdomen: Bowel sounds present, Non tender and not distended with no gaurding, rigidity or rebound. Extremities: Bilateral Lower Ext shows no edema, both legs are warm to touch with = pulse throughout Neurology:  CN II-XII grossly intact, Non focal.   Psych:  TP linear. J/I WNL. Normal speech. Appropriate eye contact and affect.  Skin:  No Rash  Data Review Lab Results  Component Value Date   HGBA1C 5.4 09/09/2013     Assessment & Plan   1. Acute bronchitis, unspecified organism Cover for atypicals and because at higher risk for opportunistic infection due to being HIV positive.   - azithromycin (ZITHROMAX) 250 MG tablet; Take 2 today, then 1 tablet days  2-5  Dispense: 6 tablet; Refill: 0 - benzonatate (TESSALON) 200 MG capsule; Take 1 capsule (200 mg total) by mouth 2 (two) times daily as needed for cough.  Dispense: 20 capsule; Refill: 0  2. HIV disease (HCC) Stable and on meds.  Continue f/up with ID.   Patient have been counseled extensively about nutrition and exercise  Return in about 4 weeks (around 08/11/2016) for assign to PCP(hasn't been here in > 65yr.;  Sooner if needed  The patient was given clear instructions to go to ER or return to medical center if symptoms don't improve, worsen or new problems develop. The  patient verbalized understanding. The patient was told to call to get lab results if they haven't heard anything in the next week.     [place], PA-C [facility] [place], [place] [phone]   07/14/2016, 9:56 AMPatient ID: Samuel Bond, male   DOB: [birth year], 35 y.o.   MRN: [id number]

## 2016-07-14 NOTE — Patient Instructions (Signed)

## 2016-10-02 ENCOUNTER — Other Ambulatory Visit: Payer: BLUE CROSS/BLUE SHIELD

## 2016-10-02 ENCOUNTER — Other Ambulatory Visit (HOSPITAL_COMMUNITY)
Admission: RE | Admit: 2016-10-02 | Discharge: 2016-10-02 | Disposition: A | Discharge status: Home / Self Care | Payer: BLUE CROSS/BLUE SHIELD | Source: Ambulatory Visit | Attending: Infectious Disease | Admitting: Infectious Disease

## 2016-10-02 DIAGNOSIS — Z113 Encounter for screening for infections with a predominantly sexual mode of transmission: Secondary | ICD-10-CM | POA: Diagnosis not present

## 2016-10-02 DIAGNOSIS — B20 Human immunodeficiency virus [HIV] disease: Secondary | ICD-10-CM

## 2016-10-02 LAB — CBC WITH DIFFERENTIAL/PLATELET
BASOS ABS: 0 {cells}/uL (ref 0–200)
Basophils Relative: 0 %
EOS ABS: 138 {cells}/uL (ref 15–500)
Eosinophils Relative: 2 %
HCT: 44.9 % (ref 38.5–50.0)
HEMOGLOBIN: 15.3 g/dL (ref 13.2–17.1)
LYMPHS ABS: 1794 {cells}/uL (ref 850–3900)
Lymphocytes Relative: 26 %
MCH: 30.7 pg (ref 27.0–33.0)
MCHC: 34.1 g/dL (ref 32.0–36.0)
MCV: 90 fL (ref 80.0–100.0)
MONO ABS: 345 {cells}/uL (ref 200–950)
MPV: 8.9 fL (ref 7.5–12.5)
Monocytes Relative: 5 %
NEUTROS ABS: 4623 {cells}/uL (ref 1500–7800)
Neutrophils Relative %: 67 %
Platelets: 243 10*3/uL (ref 140–400)
RBC: 4.99 MIL/uL (ref 4.20–5.80)
RDW: 13.9 % (ref 11.0–15.0)
WBC: 6.9 10*3/uL (ref 3.8–10.8)

## 2016-10-02 LAB — COMPLETE METABOLIC PANEL WITH GFR
ALBUMIN: 4.3 g/dL (ref 3.6–5.1)
ALK PHOS: 77 U/L (ref 40–115)
ALT: 37 U/L (ref 9–46)
AST: 46 U/L — ABNORMAL HIGH (ref 10–40)
BILIRUBIN TOTAL: 0.5 mg/dL (ref 0.2–1.2)
BUN: 8 mg/dL (ref 7–25)
CO2: 25 mmol/L (ref 20–31)
CREATININE: 0.88 mg/dL (ref 0.60–1.35)
Calcium: 9.1 mg/dL (ref 8.6–10.3)
Chloride: 106 mmol/L (ref 98–110)
GLUCOSE: 140 mg/dL — AB (ref 65–99)
Potassium: 4.2 mmol/L (ref 3.5–5.3)
Sodium: 138 mmol/L (ref 135–146)
TOTAL PROTEIN: 7.4 g/dL (ref 6.1–8.1)

## 2016-10-02 LAB — LIPID PANEL
CHOLESTEROL: 161 mg/dL (ref <200)
HDL: 28 mg/dL — ABNORMAL LOW (ref >40)
LDL CALC: 86 mg/dL (ref <100)
TRIGLYCERIDES: 237 mg/dL — AB (ref <150)
Total CHOL/HDL Ratio: 5.8 Ratio — ABNORMAL HIGH (ref <5.0)
VLDL: 47 mg/dL — AB (ref <30)

## 2016-10-03 ENCOUNTER — Other Ambulatory Visit: Payer: Self-pay | Admitting: Infectious Disease

## 2016-10-03 LAB — RPR

## 2016-10-03 LAB — URINE CYTOLOGY ANCILLARY ONLY
CHLAMYDIA, DNA PROBE: NEGATIVE
NEISSERIA GONORRHEA: NEGATIVE

## 2016-10-04 LAB — T-HELPER CELL (CD4) - (RCID CLINIC ONLY)
CD4 T CELL ABS: 660 /uL (ref 400–2700)
CD4 T CELL HELPER: 33 % (ref 33–55)

## 2016-10-06 LAB — HIV-1 RNA QUANT-NO REFLEX-BLD
HIV 1 RNA Quant: 34 copies/mL — ABNORMAL HIGH (ref <20)
HIV-1 RNA Quant, Log: 1.53 Log copies/mL — ABNORMAL HIGH (ref <1.30)

## 2016-10-16 ENCOUNTER — Ambulatory Visit: Payer: Self-pay | Admitting: Infectious Disease

## 2016-10-17 ENCOUNTER — Encounter: Payer: Self-pay | Admitting: Infectious Disease

## 2016-10-17 ENCOUNTER — Ambulatory Visit (INDEPENDENT_AMBULATORY_CARE_PROVIDER_SITE_OTHER): Payer: BLUE CROSS/BLUE SHIELD | Admitting: Infectious Disease

## 2016-10-17 VITALS — BP 133/87 | HR 84 | Temp 98.4°F | Ht 68.0 in | Wt 156.0 lb

## 2016-10-17 DIAGNOSIS — R739 Hyperglycemia, unspecified: Secondary | ICD-10-CM | POA: Diagnosis not present

## 2016-10-17 DIAGNOSIS — B20 Human immunodeficiency virus [HIV] disease: Secondary | ICD-10-CM | POA: Diagnosis not present

## 2016-10-17 HISTORY — DX: Hyperglycemia, unspecified: R73.9

## 2016-10-17 NOTE — Patient Instructions (Signed)
Make appt with Olegario MessierKathy for ADAP renewal  Also when you come to meet with Olegario MessierKathy we need to give you a meningitis vaccine  We will get a test today to see if you have diabetes and if you do we will bring you back to start treatment for this

## 2016-10-17 NOTE — Progress Notes (Signed)
Chief complaint: Follow for HIV on medications Subjective:    Patient ID: Samuel Bond, male    DOB: 02-18-1981, 36 y.o.   MRN: [id number]  HPI  36 year-old [name] with HIV and LTB whom I saw in June and started on Tivicay and Truvada. [facility] perfect virological suppression and healthy immune system with healthy CD4 count.    His VL was at 35 likely a blip. His BG 140 and he claims he only had h20 day of this lab draw.   Lab Results  Component Value Date   HIV1RNAQUANT 34 (H) 10/02/2016   HIV1RNAQUANT <20 05/02/2016   HIV1RNAQUANT <20 10/06/2015      Lab Results  Component Value Date   CD4TABS 660 10/03/2016   CD4TABS 500 05/02/2016   CD4TABS 470 10/07/2015   Past Medical History:  Diagnosis Date  . GERD (gastroesophageal reflux disease)   . HIV disease (HCC)   . Maternal HIV infection during pregnancy   . 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 none     Family History  Problem Relation Age of Onset  . Asthma Father    Social History  Substance Use Topics  . Smoking status: Never Smoker  . Smokeless tobacco: Never Used  . Alcohol use No   No Known Allergies   Current Outpatient Prescriptions:  .  azithromycin (ZITHROMAX) 250 MG tablet, Take 2 today, then 1 tablet days 2-5, Disp: 6 tablet, Rfl: 0 .  benzonatate (TESSALON) 200 MG capsule, Take 1 capsule (200 mg total) by mouth 2 (two) times daily as needed for cough., Disp: 20 capsule, Rfl: 0 .  fluticasone (FLONASE) 50 MCG/ACT nasal spray, Place 2 sprays into both nostrils daily., Disp: 16 g, Rfl: 0 .  TRIUMEQ 600-50-300 MG tablet, TAKE 1 TABLET BY MOUTH DAILY, Disp: 30 tablet, Rfl: 3     Review of Systems  Constitutional: Negative for activity change, appetite change, chills, diaphoresis, fatigue, fever and unexpected weight change.  HENT: Negative for congestion, rhinorrhea, sinus pressure, sneezing, sore throat and trouble swallowing.    Eyes: Negative for photophobia and visual disturbance.  Respiratory: Negative for cough, chest tightness, shortness of breath, wheezing and stridor.   Cardiovascular: Negative for chest pain, palpitations and leg swelling.  Gastrointestinal: Negative for abdominal distention, abdominal pain, anal bleeding, blood in stool, constipation, diarrhea, nausea and vomiting.  Genitourinary: Negative for difficulty urinating, dysuria, flank pain and hematuria.  Musculoskeletal: Negative for arthralgias, back pain, gait problem, joint swelling and myalgias.  Skin: Negative for color change, pallor, rash and wound.  Neurological: Negative for dizziness, tremors, weakness, light-headedness and headaches.  Hematological: Negative for adenopathy. Does not bruise/bleed easily.  Psychiatric/Behavioral: Negative for agitation, behavioral problems, confusion, decreased concentration, dysphoric mood and sleep disturbance.       Objective:   Physical Exam  Constitutional: He is oriented to person, place, and time. He appears well-developed and well-nourished.  HENT:  Head: Normocephalic and atraumatic.  Eyes: Conjunctivae and EOM are normal.  Neck: Normal range of motion. Neck supple.  Cardiovascular: Normal rate and regular rhythm.   Pulmonary/Chest: Effort normal. No respiratory distress. He has no wheezes.  Abdominal: Soft. He exhibits no distension.  Musculoskeletal: Normal range of motion. He exhibits no edema or tenderness.  Neurological: He is alert and oriented to person, place, and time.  Skin: Skin is warm and dry. No rash noted. No erythema. No pallor.  Assessment & Plan:  HIV : Continue TRIUMEQ and renew ADAP, needs meningitis vaccine  Hyperglycemia; check A1c  I spent greater than 25 minutes with the patient including greater than 50% of time in face to face counsel of the patient re his HIV, his elevated BG and in coordination of his care.

## 2016-10-18 LAB — HEMOGLOBIN A1C
Hgb A1c MFr Bld: 5.5 % (ref <5.7)
Mean Plasma Glucose: 111 mg/dL

## 2016-10-23 ENCOUNTER — Ambulatory Visit: Payer: Self-pay

## 2016-10-23 ENCOUNTER — Ambulatory Visit: Payer: BLUE CROSS/BLUE SHIELD

## 2016-10-23 DIAGNOSIS — Z23 Encounter for immunization: Secondary | ICD-10-CM

## 2016-10-23 MED ORDER — MENINGOCOCCAL A C Y&W-135 OLIG IM SOLR
0.5000 mL | Freq: Once | INTRAMUSCULAR | Status: AC
  Administered 2016-10-23: 0.5 mL via INTRAMUSCULAR

## 2016-11-08 ENCOUNTER — Encounter: Payer: Self-pay | Admitting: Infectious Disease

## 2017-01-23 ENCOUNTER — Other Ambulatory Visit: Payer: Self-pay | Admitting: Infectious Disease

## 2017-03-20 ENCOUNTER — Ambulatory Visit: Payer: BLUE CROSS/BLUE SHIELD

## 2017-03-28 ENCOUNTER — Encounter: Payer: Self-pay | Admitting: Infectious Disease

## 2017-05-09 ENCOUNTER — Other Ambulatory Visit (HOSPITAL_COMMUNITY)
Admission: RE | Admit: 2017-05-09 | Discharge: 2017-05-09 | Disposition: A | Discharge status: Home / Self Care | Payer: BLUE CROSS/BLUE SHIELD | Source: Ambulatory Visit | Attending: Infectious Disease | Admitting: Infectious Disease

## 2017-05-09 ENCOUNTER — Other Ambulatory Visit: Payer: BLUE CROSS/BLUE SHIELD

## 2017-05-09 DIAGNOSIS — R739 Hyperglycemia, unspecified: Secondary | ICD-10-CM

## 2017-05-09 LAB — CBC WITH DIFFERENTIAL/PLATELET
BASOS ABS: 79 {cells}/uL (ref 0–200)
Basophils Relative: 1 %
EOS ABS: 158 {cells}/uL (ref 15–500)
Eosinophils Relative: 2 %
HCT: 43.1 % (ref 38.5–50.0)
Hemoglobin: 15 g/dL (ref 13.2–17.1)
LYMPHS PCT: 26 %
Lymphs Abs: 2054 cells/uL (ref 850–3900)
MCH: 31.2 pg (ref 27.0–33.0)
MCHC: 34.8 g/dL (ref 32.0–36.0)
MCV: 89.6 fL (ref 80.0–100.0)
MONOS PCT: 5 %
MPV: 8.4 fL (ref 7.5–12.5)
Monocytes Absolute: 395 cells/uL (ref 200–950)
Neutro Abs: 5214 cells/uL (ref 1500–7800)
Neutrophils Relative %: 66 %
Platelets: 285 10*3/uL (ref 140–400)
RBC: 4.81 MIL/uL (ref 4.20–5.80)
RDW: 13.1 % (ref 11.0–15.0)
WBC: 7.9 10*3/uL (ref 3.8–10.8)

## 2017-05-10 LAB — LIPID PANEL
CHOL/HDL RATIO: 6.3 ratio — AB (ref <5.0)
Cholesterol: 151 mg/dL (ref <200)
HDL: 24 mg/dL — ABNORMAL LOW (ref >40)
Triglycerides: 401 mg/dL — ABNORMAL HIGH (ref <150)

## 2017-05-10 LAB — COMPLETE METABOLIC PANEL WITH GFR
ALT: 9 U/L (ref 9–46)
AST: 20 U/L (ref 10–40)
Albumin: 4.5 g/dL (ref 3.6–5.1)
Alkaline Phosphatase: 78 U/L (ref 40–115)
BILIRUBIN TOTAL: 0.4 mg/dL (ref 0.2–1.2)
BUN: 6 mg/dL — ABNORMAL LOW (ref 7–25)
CHLORIDE: 105 mmol/L (ref 98–110)
CO2: 18 mmol/L — AB (ref 20–32)
Calcium: 9.1 mg/dL (ref 8.6–10.3)
Creat: 0.82 mg/dL (ref 0.60–1.35)
Glucose, Bld: 119 mg/dL — ABNORMAL HIGH (ref 65–99)
Potassium: 3.8 mmol/L (ref 3.5–5.3)
Sodium: 138 mmol/L (ref 135–146)
TOTAL PROTEIN: 7.4 g/dL (ref 6.1–8.1)

## 2017-05-10 LAB — RPR

## 2017-05-10 LAB — URINE CYTOLOGY ANCILLARY ONLY
CHLAMYDIA, DNA PROBE: NEGATIVE
Neisseria Gonorrhea: NEGATIVE

## 2017-05-10 LAB — T-HELPER CELL (CD4) - (RCID CLINIC ONLY)
CD4 % Helper T Cell: 28 % — ABNORMAL LOW (ref 33–55)
CD4 T CELL ABS: 620 /uL (ref 400–2700)

## 2017-05-11 LAB — HIV-1 RNA QUANT-NO REFLEX-BLD
HIV 1 RNA QUANT: 65 {copies}/mL — AB
HIV-1 RNA Quant, Log: 1.81 Log copies/mL — ABNORMAL HIGH

## 2017-05-23 ENCOUNTER — Encounter: Payer: Self-pay | Admitting: Infectious Disease

## 2017-05-23 ENCOUNTER — Ambulatory Visit (INDEPENDENT_AMBULATORY_CARE_PROVIDER_SITE_OTHER): Payer: BLUE CROSS/BLUE SHIELD | Admitting: Infectious Disease

## 2017-05-23 VITALS — BP 130/87 | HR 88 | Temp 98.7°F | Wt 157.0 lb

## 2017-05-23 DIAGNOSIS — R739 Hyperglycemia, unspecified: Secondary | ICD-10-CM

## 2017-05-23 DIAGNOSIS — B86 Scabies: Secondary | ICD-10-CM

## 2017-05-23 DIAGNOSIS — Z23 Encounter for immunization: Secondary | ICD-10-CM | POA: Diagnosis not present

## 2017-05-23 DIAGNOSIS — B20 Human immunodeficiency virus [HIV] disease: Secondary | ICD-10-CM

## 2017-05-23 HISTORY — DX: Scabies: B86

## 2017-05-23 MED ORDER — PERMETHRIN 5 % EX CREA: TOPICAL_CREAM | CUTANEOUS | 1 refills | Status: DC

## 2017-05-23 NOTE — Patient Instructions (Signed)
I have sent in rx for permethrin cream to treat for scabies for you and for your wife  ANY one else in the home should also be treated to prevent transmission, development of this  WASH ALL OF YOUR BED LINENS, SHEETS, AND CLOTHES IN HOT WATER

## 2017-05-23 NOTE — Progress Notes (Signed)
Chief complaint: itching between fingers Subjective:    Patient ID: Samuel Bond, male    DOB: 10-05-1980, 36 y.o.   MRN: [id number]  HPI  36 year-old [place] man with HIV and LTB whom I saw in June and started on Tivicay and Truvada. changed over to [facility] perfect virological suppression and healthy immune system with healthy CD4 count.    He has had a few "blips" but no virological failure and denies ever missing doses.  He works in [employer] and is concerned he might have scabies with itching between fingers on both hands.      Lab Results  Component Value Date   HIV1RNAQUANT 65 (H) 05/09/2017   HIV1RNAQUANT 34 (H) 10/02/2016   HIV1RNAQUANT <20 05/02/2016      Lab Results  Component Value Date   CD4TABS 620 05/09/2017   CD4TABS 660 10/03/2016   CD4TABS 500 05/02/2016   Past Medical History:  Diagnosis Date  . GERD (gastroesophageal reflux disease)   . HIV disease (HCC)   . Hyperglycemia 10/17/2016  . Maternal HIV infection during pregnancy   . 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 none     Family History  Problem Relation Age of Onset  . Asthma Father    Social History  Substance Use Topics  . Smoking status: Never Smoker  . Smokeless tobacco: Never Used  . Alcohol use No   No Known Allergies   Current Outpatient Prescriptions:  .  TRIUMEQ 600-50-300 MG tablet, TAKE 1 TABLET BY MOUTH DAILY, Disp: 30 tablet, Rfl: 4     Review of Systems  Constitutional: Negative for activity change, appetite change, chills, diaphoresis, fatigue, fever and unexpected weight change.  HENT: Negative for congestion, rhinorrhea, sinus pressure, sneezing, sore throat and trouble swallowing.   Eyes: Negative for photophobia and visual disturbance.  Respiratory: Negative for cough, chest tightness, shortness of breath, wheezing and stridor.   Cardiovascular: Negative for chest pain, palpitations and leg swelling.    Gastrointestinal: Negative for abdominal distention, abdominal pain, anal bleeding, blood in stool, constipation, diarrhea, nausea and vomiting.  Genitourinary: Negative for difficulty urinating, dysuria, flank pain and hematuria.  Musculoskeletal: Negative for arthralgias, back pain, gait problem, joint swelling and myalgias.  Skin: Positive for rash. Negative for color change, pallor and wound.  Neurological: Negative for dizziness, tremors, weakness, light-headedness and headaches.  Hematological: Negative for adenopathy. Does not bruise/bleed easily.  Psychiatric/Behavioral: Negative for agitation, behavioral problems, confusion, decreased concentration, dysphoric mood and sleep disturbance.       Objective:   Physical Exam  Constitutional: He is oriented to person, place, and time. He appears well-developed and well-nourished.  HENT:  Head: Normocephalic and atraumatic.  Eyes: Conjunctivae and EOM are normal.  Neck: Normal range of motion. Neck supple.  Cardiovascular: Normal rate and regular rhythm.   Pulmonary/Chest: Effort normal. No respiratory distress. He has no wheezes.  Abdominal: Soft. He exhibits no distension.  Musculoskeletal: Normal range of motion. He exhibits no edema or tenderness.  Neurological: He is alert and oriented to person, place, and time.  Skin: Skin is warm and dry. No rash noted. There is erythema. No pallor.    Rash 05/23/17:            Assessment & Plan:   Rash: could indeed by scabies will treat him with permethrin and also prophylax his wife who is also my pt. Hot wash all clothing and bedding.   HIV :  Continue TRIUMEQ and ADAP renewed  Hyperglycemia; A1c was normal  I spent greater than 25 minutes with the patient including greater than 50% of time in face to face counsel of the patient re his HIV, pruritic skin condition and likely scabies, elevated BG and in coordination of his care.

## 2017-07-17 ENCOUNTER — Other Ambulatory Visit: Payer: Self-pay | Admitting: Infectious Disease

## 2017-09-05 ENCOUNTER — Other Ambulatory Visit: Payer: BLUE CROSS/BLUE SHIELD

## 2017-09-24 ENCOUNTER — Ambulatory Visit: Payer: BLUE CROSS/BLUE SHIELD | Admitting: Infectious Disease

## 2017-10-04 ENCOUNTER — Encounter: Payer: Self-pay | Admitting: Infectious Disease

## 2017-10-18 ENCOUNTER — Other Ambulatory Visit: Payer: BLUE CROSS/BLUE SHIELD

## 2017-11-19 ENCOUNTER — Other Ambulatory Visit (HOSPITAL_COMMUNITY)
Admission: RE | Admit: 2017-11-19 | Discharge: 2017-11-19 | Disposition: A | Discharge status: Home / Self Care | Payer: BLUE CROSS/BLUE SHIELD | Source: Ambulatory Visit | Attending: Infectious Disease | Admitting: Infectious Disease

## 2017-11-19 ENCOUNTER — Other Ambulatory Visit: Payer: BLUE CROSS/BLUE SHIELD

## 2017-11-19 DIAGNOSIS — B20 Human immunodeficiency virus [HIV] disease: Secondary | ICD-10-CM | POA: Insufficient documentation

## 2017-11-20 LAB — CBC WITH DIFFERENTIAL/PLATELET
BASOS ABS: 32 {cells}/uL (ref 0–200)
Basophils Relative: 0.5 %
Eosinophils Absolute: 122 cells/uL (ref 15–500)
Eosinophils Relative: 1.9 %
HEMATOCRIT: 44.1 % (ref 38.5–50.0)
Hemoglobin: 15.2 g/dL (ref 13.2–17.1)
LYMPHS ABS: 1600 {cells}/uL (ref 850–3900)
MCH: 29.7 pg (ref 27.0–33.0)
MCHC: 34.5 g/dL (ref 32.0–36.0)
MCV: 86.3 fL (ref 80.0–100.0)
MPV: 12.4 fL (ref 7.5–12.5)
Monocytes Relative: 6.4 %
NEUTROS PCT: 66.2 %
Neutro Abs: 4237 cells/uL (ref 1500–7800)
PLATELETS: 193 10*3/uL (ref 140–400)
RBC: 5.11 10*6/uL (ref 4.20–5.80)
RDW: 13.4 % (ref 11.0–15.0)
TOTAL LYMPHOCYTE: 25 %
WBC: 6.4 10*3/uL (ref 3.8–10.8)
WBCMIX: 410 {cells}/uL (ref 200–950)

## 2017-11-20 LAB — COMPLETE METABOLIC PANEL WITH GFR
AG Ratio: 1.5 (calc) (ref 1.0–2.5)
ALBUMIN MSPROF: 4.5 g/dL (ref 3.6–5.1)
ALKALINE PHOSPHATASE (APISO): 94 U/L (ref 40–115)
ALT: 34 U/L (ref 9–46)
AST: 41 U/L — ABNORMAL HIGH (ref 10–40)
BUN: 8 mg/dL (ref 7–25)
CO2: 27 mmol/L (ref 20–32)
Calcium: 9.5 mg/dL (ref 8.6–10.3)
Chloride: 107 mmol/L (ref 98–110)
Creat: 0.85 mg/dL (ref 0.60–1.35)
GFR, EST AFRICAN AMERICAN: 131 mL/min/{1.73_m2} (ref >=60)
GFR, Est Non African American: 113 mL/min/{1.73_m2} (ref >=60)
GLOBULIN: 3.1 g/dL (ref 1.9–3.7)
Glucose, Bld: 96 mg/dL (ref 65–99)
Potassium: 4.3 mmol/L (ref 3.5–5.3)
SODIUM: 141 mmol/L (ref 135–146)
TOTAL PROTEIN: 7.6 g/dL (ref 6.1–8.1)
Total Bilirubin: 0.5 mg/dL (ref 0.2–1.2)

## 2017-11-20 LAB — T-HELPER CELL (CD4) - (RCID CLINIC ONLY)
CD4 % Helper T Cell: 30 % — ABNORMAL LOW (ref 33–55)
CD4 T CELL ABS: 520 /uL (ref 400–2700)

## 2017-11-20 LAB — LIPID PANEL
CHOLESTEROL: 138 mg/dL (ref <200)
HDL: 31 mg/dL — ABNORMAL LOW (ref >40)
LDL CHOLESTEROL (CALC): 73 mg/dL
Non-HDL Cholesterol (Calc): 107 mg/dL (calc) (ref <130)
TRIGLYCERIDES: 246 mg/dL — AB (ref <150)
Total CHOL/HDL Ratio: 4.5 (calc) (ref <5.0)

## 2017-11-20 LAB — URINE CYTOLOGY ANCILLARY ONLY
CHLAMYDIA, DNA PROBE: NEGATIVE
NEISSERIA GONORRHEA: NEGATIVE

## 2017-11-20 LAB — MICROALBUMIN / CREATININE URINE RATIO
Creatinine, Urine: 22 mg/dL (ref 20–320)
MICROALB UR: 0.5 mg/dL
Microalb Creat Ratio: 23 mcg/mg creat (ref <30)

## 2017-11-20 LAB — RPR: RPR: NONREACTIVE

## 2017-11-21 LAB — HIV-1 RNA QUANT-NO REFLEX-BLD
HIV 1 RNA Quant: <20 copies/mL — AB
HIV-1 RNA Quant, Log: <1.3 Log copies/mL — AB

## 2017-12-03 ENCOUNTER — Ambulatory Visit: Payer: BLUE CROSS/BLUE SHIELD | Admitting: Infectious Disease

## 2017-12-26 ENCOUNTER — Other Ambulatory Visit: Payer: Self-pay | Admitting: Infectious Disease

## 2018-01-07 ENCOUNTER — Other Ambulatory Visit: Payer: Self-pay | Admitting: Infectious Disease

## 2018-01-09 ENCOUNTER — Ambulatory Visit: Payer: BLUE CROSS/BLUE SHIELD | Admitting: Infectious Disease

## 2018-01-16 ENCOUNTER — Ambulatory Visit (INDEPENDENT_AMBULATORY_CARE_PROVIDER_SITE_OTHER): Payer: BLUE CROSS/BLUE SHIELD | Admitting: Infectious Disease

## 2018-01-16 ENCOUNTER — Encounter: Payer: Self-pay | Admitting: Infectious Disease

## 2018-01-16 VITALS — BP 125/84 | HR 80 | Temp 98.1°F | Ht 65.0 in | Wt 148.0 lb

## 2018-01-16 DIAGNOSIS — R739 Hyperglycemia, unspecified: Secondary | ICD-10-CM | POA: Diagnosis not present

## 2018-01-16 DIAGNOSIS — K219 Gastro-esophageal reflux disease without esophagitis: Secondary | ICD-10-CM | POA: Diagnosis not present

## 2018-01-16 DIAGNOSIS — B20 Human immunodeficiency virus [HIV] disease: Secondary | ICD-10-CM | POA: Diagnosis not present

## 2018-01-16 DIAGNOSIS — Z23 Encounter for immunization: Secondary | ICD-10-CM | POA: Diagnosis not present

## 2018-01-16 NOTE — Progress Notes (Signed)
Chief complaint: itching between fingers Subjective:    Patient ID: Samuel Bond, male    DOB: Jun 16, 1981, 37 y.o.   MRN: [id number]  HPI  37 year-old [place] man with HIV and LTB whom I saw in June and started on Tivicay and Truvada. changed over to [facility] perfect virological suppression and healthy immune system with healthy CD4 count.    He has had a few "blips" but no virological failure and denies ever missing doses.  We treated him for scabies recently and symptoms resolved.   Today he asked me if there were any "newer better drugs than TRIUMEQ."     Past Medical History:  Diagnosis Date  . GERD (gastroesophageal reflux disease)   . HIV disease (HCC)   . Hyperglycemia 10/17/2016  . Maternal HIV infection during pregnancy   . Scabies 05/23/2017  . 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 none     Family History  Problem Relation Age of Onset  . Asthma Father    Social History   Tobacco Use  . Smoking status: Never Smoker  . Smokeless tobacco: Never Used  Substance Use Topics  . Alcohol use: No    Alcohol/week: 0.0 oz  . Drug use: No   No Known Allergies   Current Outpatient Medications:  .  TRIUMEQ 600-50-300 MG tablet, TAKE 1 TABLET BY MOUTH DAILY, Disp: 30 tablet, Rfl: 2 .  permethrin (ELIMITE) 5 % cream, Apply head to toe and leave on for 8-14 hours, then [facility] it off, repeat in 2 weeks time, Disp: 60 g, Rfl: 1     Review of Systems  Constitutional: Negative for activity change, appetite change, chills, diaphoresis, fatigue, fever and unexpected weight change.  HENT: Negative for congestion, rhinorrhea, sinus pressure, sneezing, sore throat and trouble swallowing.   Eyes: Negative for photophobia and visual disturbance.  Respiratory: Negative for cough, chest tightness, shortness of breath, wheezing and stridor.   Cardiovascular: Negative for chest pain, palpitations and leg swelling.  Gastrointestinal:  Negative for abdominal distention, abdominal pain, anal bleeding, blood in stool, constipation, diarrhea, nausea and vomiting.  Genitourinary: Negative for difficulty urinating, dysuria, flank pain and hematuria.  Musculoskeletal: Negative for arthralgias, back pain, gait problem, joint swelling and myalgias.  Skin: Negative for color change, pallor, rash and wound.  Neurological: Negative for dizziness, tremors, weakness, light-headedness and headaches.  Hematological: Negative for adenopathy. Does not bruise/bleed easily.  Psychiatric/Behavioral: Negative for agitation, behavioral problems, confusion, decreased concentration, dysphoric mood and sleep disturbance.       Objective:   Physical Exam  Constitutional: He is oriented to person, place, and time. He appears well-developed and well-nourished.  HENT:  Head: Normocephalic and atraumatic.  Eyes: Conjunctivae and EOM are normal.  Neck: Normal range of motion. Neck supple.  Cardiovascular: Normal rate and regular rhythm.  Pulmonary/Chest: Effort normal. No respiratory distress. He has no wheezes.  Abdominal: Soft. He exhibits no distension.  Musculoskeletal: Normal range of motion. He exhibits no edema or tenderness.  Neurological: He is alert and oriented to person, place, and time.  Skin: Skin is warm and dry. No rash noted. No erythema. No pallor.        Assessment & Plan:  HIV : Continue TRIUMEQ. We discussed controversy re ABC and CV risk and why this would be an arguent to go to [facility] if he wants to "fine tune" his regimen. Beyond this I dont see much else that will improve upon  his STR.  Rash resolved  I spent greater than 25 minutes with the patient including greater than 50% of time in face to face counsel of the patient re different ARV regimens and specifically with re to the components of TRIUMEQ, BIKTARVY and other STR> and in coordination of his care.

## 2018-03-28 ENCOUNTER — Other Ambulatory Visit: Payer: BLUE CROSS/BLUE SHIELD

## 2018-04-03 ENCOUNTER — Encounter: Payer: Self-pay | Admitting: Infectious Disease

## 2018-04-11 ENCOUNTER — Other Ambulatory Visit: Payer: Self-pay | Admitting: Infectious Disease

## 2018-04-25 ENCOUNTER — Other Ambulatory Visit: Payer: BLUE CROSS/BLUE SHIELD

## 2018-04-25 DIAGNOSIS — B20 Human immunodeficiency virus [HIV] disease: Secondary | ICD-10-CM

## 2018-04-29 LAB — CBC WITH DIFFERENTIAL/PLATELET
BASOS ABS: 17 {cells}/uL (ref 0–200)
Basophils Relative: 0.3 %
EOS PCT: 1.7 %
Eosinophils Absolute: 99 cells/uL (ref 15–500)
HCT: 41.4 % (ref 38.5–50.0)
HEMOGLOBIN: 14 g/dL (ref 13.2–17.1)
Lymphs Abs: 1496 cells/uL (ref 850–3900)
MCH: 30.4 pg (ref 27.0–33.0)
MCHC: 33.8 g/dL (ref 32.0–36.0)
MCV: 90 fL (ref 80.0–100.0)
MPV: 9.9 fL (ref 7.5–12.5)
Monocytes Relative: 4.7 %
NEUTROS ABS: 3915 {cells}/uL (ref 1500–7800)
NEUTROS PCT: 67.5 %
Platelets: 249 10*3/uL (ref 140–400)
RBC: 4.6 10*6/uL (ref 4.20–5.80)
RDW: 13.6 % (ref 11.0–15.0)
Total Lymphocyte: 25.8 %
WBC: 5.8 10*3/uL (ref 3.8–10.8)
WBCMIX: 273 {cells}/uL (ref 200–950)

## 2018-04-29 LAB — COMPLETE METABOLIC PANEL WITH GFR
AG RATIO: 1.5 (calc) (ref 1.0–2.5)
ALKALINE PHOSPHATASE (APISO): 86 U/L (ref 40–115)
ALT: 19 U/L (ref 9–46)
AST: 24 U/L (ref 10–40)
Albumin: 4.5 g/dL (ref 3.6–5.1)
BUN: 11 mg/dL (ref 7–25)
CO2: 25 mmol/L (ref 20–32)
CREATININE: 0.82 mg/dL (ref 0.60–1.35)
Calcium: 9.3 mg/dL (ref 8.6–10.3)
Chloride: 103 mmol/L (ref 98–110)
GFR, Est African American: 132 mL/min/{1.73_m2} (ref >=60)
GFR, Est Non African American: 114 mL/min/{1.73_m2} (ref >=60)
Globulin: 3 g/dL (calc) (ref 1.9–3.7)
Glucose, Bld: 145 mg/dL — ABNORMAL HIGH (ref 65–99)
Potassium: 3.8 mmol/L (ref 3.5–5.3)
Sodium: 138 mmol/L (ref 135–146)
Total Bilirubin: 0.5 mg/dL (ref 0.2–1.2)
Total Protein: 7.5 g/dL (ref 6.1–8.1)

## 2018-04-29 LAB — HIV-1 RNA QUANT-NO REFLEX-BLD
HIV 1 RNA QUANT: NOT DETECTED {copies}/mL
HIV-1 RNA QUANT, LOG: NOT DETECTED {Log_copies}/mL

## 2018-04-29 LAB — RPR: RPR: NONREACTIVE

## 2018-05-09 ENCOUNTER — Encounter: Payer: BLUE CROSS/BLUE SHIELD | Admitting: Infectious Disease

## 2018-05-30 ENCOUNTER — Ambulatory Visit: Payer: BLUE CROSS/BLUE SHIELD | Attending: Family Medicine | Admitting: Family Medicine

## 2018-05-30 ENCOUNTER — Other Ambulatory Visit: Payer: Self-pay

## 2018-05-30 ENCOUNTER — Encounter: Payer: Self-pay | Admitting: Family Medicine

## 2018-05-30 VITALS — BP 125/84 | HR 73 | Temp 97.9°F | Resp 16 | Ht 65.0 in | Wt 142.8 lb

## 2018-05-30 DIAGNOSIS — R5383 Other fatigue: Secondary | ICD-10-CM

## 2018-05-30 DIAGNOSIS — M2550 Pain in unspecified joint: Secondary | ICD-10-CM

## 2018-05-30 DIAGNOSIS — R21 Rash and other nonspecific skin eruption: Secondary | ICD-10-CM

## 2018-05-30 DIAGNOSIS — B20 Human immunodeficiency virus [HIV] disease: Secondary | ICD-10-CM

## 2018-05-30 DIAGNOSIS — Z830 Family history of human immunodeficiency virus [HIV] disease: Secondary | ICD-10-CM | POA: Insufficient documentation

## 2018-05-30 DIAGNOSIS — R739 Hyperglycemia, unspecified: Secondary | ICD-10-CM | POA: Diagnosis not present

## 2018-05-30 DIAGNOSIS — Z23 Encounter for immunization: Secondary | ICD-10-CM

## 2018-05-30 DIAGNOSIS — K219 Gastro-esophageal reflux disease without esophagitis: Secondary | ICD-10-CM | POA: Insufficient documentation

## 2018-05-30 MED ORDER — TRIAMCINOLONE 0.1 % CREAM:EUCERIN CREAM 1:1: 1.0000 "application " | TOPICAL_CREAM | Freq: Three times a day (TID) | CUTANEOUS | 4 refills | Status: DC

## 2018-05-30 NOTE — Progress Notes (Signed)
Subjective:    Patient ID: Samuel Bond, male    DOB: 1981/04/15, 37 y.o.   MRN: [id number]  HPI 37 year old male last seen in the office 07/14/2016 who presents to reestablish care.  Patient is slightly difficult to understand.  Patient declines use of interpreter services.  Patient reports that he is originally from [place].  Patient with complaint of having pain in multiple joints and states that he was told that he has gout.  Patient states that he tends to have pain in his right elbow, right wrist and right knee.  Patient is not on any medication for gout.  Patient reports no joint pain or swelling at today's visit.  Patient wonders if he needs to be on medication to help prevent issues with future joint pain due to gout.  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      Patient also reports that he has had an itchy rash that is currently just on the back of his hands.  Patient states that he was given a cream for treatment of the rash but this is not been helpful.(On review of medical chart, patient also has HIV and was provided with prescription for Elimite cream for treatment of scabies earlier this year).  Patient reports that he is trying to washing his hands repeatedly and very hot water to help with the rash and itching however the back of his hands continue to itch.  Patient states that the skin on his hands is also becoming thickened and irritated.      Patient reports that he feels that he is more tired than normal.  Patient states that he feels as if he has fatigue.  Patient denies any change in appetite.  No abdominal pain.  No chest pain, no shortness of breath.  Patient denies any cough.  Patient states in the past he had TB for which he was treated. Past Medical History:  Diagnosis Date  . GERD (gastroesophageal reflux disease)   . HIV disease (HCC)   . Hyperglycemia 10/17/2016  . Maternal HIV  infection during pregnancy   . Scabies 05/23/2017  . 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 none     Social History   Tobacco Use  . Smoking status: Never Smoker  . Smokeless tobacco: Never Used  Substance Use Topics  . Alcohol use: No    Alcohol/week: 0.0 standard drinks  . Drug use: No  No Known Allergies  Review of Systems  Constitutional: Positive for fatigue. Negative for chills and fever.  Respiratory: Negative for cough and shortness of breath.   Cardiovascular: Negative for chest pain, palpitations and leg swelling.  Gastrointestinal: Negative for anal bleeding and nausea.  Genitourinary: Negative for dysuria and frequency.  Musculoskeletal: Positive for arthralgias and joint swelling.  Neurological: Negative for dizziness and headaches.       Objective:   Physical Exam BP 125/84   Pulse 73   Temp 97.9 F (36.6 C) (Oral)   Resp 16   Ht 5\' 5"  (1.651 m)   Wt 142 lb 12.8 oz (64.8 kg)   SpO2 97%   BMI 23.76 kg/m  Nurse's notes and vital signs reviewed General- small framed but well-nourished well-developed male in no acute distress Neck-supple, no lymphadenopathy, no thyromegaly, no carotid bruit Lungs-clear to auscultation bilaterally Cardiovascular-regular rate and rhythm Abdomen-soft, nontender Back-no  CVA tenderness, no reproducible back pain Musculoskeletal- patient does not have any joint tenderness, no joint instability, no joint swelling, no increased warmth of the joints and no reproducible joint pain at today's visit.  No evidence of gouty tophi. Extremities-no edema Skin- patient with hyperpigmented/hypertrophic skin on the dorsum of the hands bilaterally.  No rash on the wrist or arms or legs      Assessment & Plan:  1. Multiple joint pain Patient with complaint of multiple joint pain and repeatedly states that he has gout however patient has no evidence of gouty tophi and no prior labs suggesting elevation  of uric acid in the past.  Patient will have uric acid level and vitamin D level done at today's visit and follow-up of his complaints of multiple joint pain and patient will be notified if any further treatment is needed based on these results. - Uric Acid - Vitamin D, 25-hydroxy  2. Fatigue, unspecified type Patient with complaint of fatigue as well as multiple joint pain.  Patient believes that his vitamin D level may be low and this will be checked at today's visit as well as a hemoglobin A1c as patient has had some prior elevated blood sugars on prior blood work.  Patient is also HIV positive which may be contributing to his fatigue or medication that he takes for his HIV may also be contributing to fatigue.  Patient will continue follow-up with his ID specialist. - Vitamin D, 25-hydroxy  3. Elevated blood sugar On review of prior labs, patient has had prior elevated blood sugar and patient will have hemoglobin A1c at today's visit.  Patient with glucose of 145 on blood work done 04/25/2018 on review of chart. - Hemoglobin A1c  4. Rash and nonspecific skin eruption Patient with a rash on the back of his hands which could represent contact dermatitis versus dyshidrotic eczema is patient reports that hot water exposure makes the rash worse.  Patient provided with prescription for triamcinolone compounded with Eucerin 1-1 - Triamcinolone Acetonide (TRIAMCINOLONE 0.1 % CREAM : EUCERIN) CREA; Apply 1 application topically 3 (three) times daily.  Dispense: 1 each; Refill: 4  5.  HIV infection Patient with HIV for which he is followed by Dr. Paulette Blanchornelius [place] and infectious disease.  Patient complains at today's visit fatigue multiple joint pain and will have labs done in follow-up the patient should also continue to follow-up closely with his infectious disease doctor in case his fatigue and joint pain are related to his HIV infection on medication she is for treatment.    6. Need for immunization  against influenza Patient was offered and received influenza immunization at today's visit.  Patient also received educational handout  An After Visit Summary was printed and given to the patient.  Return in about 4 weeks (around 06/27/2018) for f/u with PCP.

## 2018-05-30 NOTE — Progress Notes (Signed)
Flu shot: yes Pain: 0  

## 2018-05-31 ENCOUNTER — Telehealth: Payer: Self-pay

## 2018-05-31 LAB — URIC ACID: Uric Acid: 4.5 mg/dL (ref 3.7–8.6)

## 2018-05-31 LAB — VITAMIN D 25 HYDROXY (VIT D DEFICIENCY, FRACTURES): Vit D, 25-Hydroxy: 49.2 ng/mL (ref 30.0–100.0)

## 2018-05-31 LAB — HEMOGLOBIN A1C
Est. average glucose Bld gHb Est-mCnc: 111 mg/dL
Hgb A1c MFr Bld: 5.5 % (ref 4.8–5.6)

## 2018-05-31 NOTE — Telephone Encounter (Signed)
Clarified that the compound was to be a 1:1 ratio and for 60gm quantity

## 2018-06-06 ENCOUNTER — Ambulatory Visit (INDEPENDENT_AMBULATORY_CARE_PROVIDER_SITE_OTHER): Payer: BLUE CROSS/BLUE SHIELD | Admitting: Family

## 2018-06-06 ENCOUNTER — Encounter: Payer: Self-pay | Admitting: Family

## 2018-06-06 VITALS — BP 127/88 | HR 72 | Temp 98.3°F | Wt 140.1 lb

## 2018-06-06 DIAGNOSIS — B20 Human immunodeficiency virus [HIV] disease: Secondary | ICD-10-CM

## 2018-06-06 MED ORDER — ABACAVIR-DOLUTEGRAVIR-LAMIVUD 600-50-300 MG PO TABS: 1.0000 | ORAL_TABLET | Freq: Every day | ORAL | 3 refills | Status: DC

## 2018-06-06 NOTE — Patient Instructions (Signed)
Nice to meet you.  Please continue to take your Triumeq as prescribed.  Everything is looking good.  Please continue to follow-up with Dr. Zenaida NieceVan Da in 4 months or sooner if needed with blood work 1 to 2 weeks prior to your appointment.

## 2018-06-06 NOTE — Progress Notes (Signed)
Subjective:    Patient ID: Samuel Bond, male    DOB: 08-26-1981, 37 y.o.   MRN: [id number]  Chief Complaint  Patient presents with  . HIV Positive/AIDS     HPI:  Samuel Bond is a 37 y.o. male who presents today for follow up of HIV disease.   [name] was last seen in the office on 01/16/18.  He was maintained on his regimen of Triumeq with a viral load that was undetectable and a CD4 count of 520.  Most recent blood work on 8/8 shows continued undetectable viral load and an RPR that was non-reactive. Kidney function, liver function and electrolytes were within the normal ranges.   [name] continues to take his Triumeq as prescribed with no adverse side effects or missed doses.  He continues to be insured through [employer] and has no problems obtaining his [facility].  He continues to work and has had a vacation recently. Denies fevers, chills, night sweats, headaches, changes in vision, neck pain/stiffness, nausea, diarrhea, vomiting, lesions or rashes.     [facility]  PHQ-9 Total Score  6        No Known Allergies    Outpatient Medications Prior to Visit  Medication Sig Dispense Refill  . permethrin (ELIMITE) 5 % cream Apply head to toe and leave on for 8-14 hours, then [facility], repeat in 2 weeks time 60 g 1  . Triamcinolone Acetonide (TRIAMCINOLONE 0.1 % CREAM : EUCERIN) CREA Apply 1 application topically 3 (three) times daily. 1 each 4  . TRIUMEQ 600-50-300 MG tablet TAKE 1 TABLET BY MOUTH DAILY 30 tablet 3   No facility-administered medications prior to visit.      Past Medical History:  Diagnosis Date  . GERD (gastroesophageal reflux disease)   . HIV disease (HCC)   . Hyperglycemia 10/17/2016  . Maternal HIV infection during pregnancy   . Scabies 05/23/2017  . 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 none           Review of Systems  Constitutional: Negative for appetite change, chills, fatigue, fever and unexpected weight change.  Eyes: Negative for visual disturbance.  Respiratory: Negative for cough, chest tightness, shortness of breath and wheezing.   Cardiovascular: Negative for chest pain and leg swelling.  Gastrointestinal: Negative for abdominal pain, constipation, diarrhea, nausea and vomiting.  Genitourinary: Negative for dysuria, flank pain, frequency, genital sores, hematuria and urgency.  Skin: Negative for rash.  Allergic/Immunologic: Negative for immunocompromised state.  Neurological: Negative for dizziness and headaches.      Objective:    BP 127/88   Pulse 72   Temp 98.3 F (36.8 C)   Wt 140 lb 1.9 oz (63.6 kg)   BMI 23.32 kg/m  Nursing note and vital signs reviewed.  Physical Exam  Constitutional: He is oriented to person, place, and time. He appears well-developed. No distress.  HENT:  Mouth/Throat: Oropharynx is clear and moist.  Eyes: Conjunctivae are normal.  Neck: Neck supple.  Cardiovascular: Normal rate, regular rhythm, normal heart sounds and intact distal pulses. Exam reveals no gallop and no friction rub.  No murmur heard. Pulmonary/Chest: Effort normal and breath sounds normal. No respiratory distress. He has no wheezes. He has no rales. He exhibits no tenderness.  Abdominal: Soft. Bowel sounds are normal. There is no tenderness.  Lymphadenopathy:  He has no cervical adenopathy.  Neurological: He is alert and oriented to person, place, and time.  Skin: Skin is warm and dry. No rash noted.  Psychiatric: He has a normal mood and affect. His behavior is normal. Judgment and thought content normal.       Assessment & Plan:   Problem List Items Addressed This Visit      Other   HIV disease (HCC) - Primary    [name] has well-controlled HIV disease with his current medication regimen with no adverse side effects.  Current viral load remains  undetectable.  He is adherent with the medication regimen and has no problems obtaining his medication.  No signs/symptoms of opportunistic infection or progressive HIV disease through history or physical exam.  He has already received his immunizations including his flu shot.  Continue current dose of Triumeq.  Plan for office follow-up in 4 months or sooner if needed with blood work 1 to 2 weeks prior to appointment.      Relevant Medications   abacavir-dolutegravir-lamiVUDine (TRIUMEQ) 600-50-300 MG tablet   Other Relevant Orders   T-helper cell (CD4)- ([facility] clinic only)   HIV-1 RNA quant-no reflex-bld   CBC   Comprehensive metabolic panel   Lipid panel   RPR       I have changed Samuel Bond's TRIUMEQ to abacavir-dolutegravir-lamiVUDine. I am also having him maintain his permethrin and triamcinolone 0.1 % cream : eucerin.   Meds ordered this encounter  Medications  . abacavir-dolutegravir-lamiVUDine (TRIUMEQ) 600-50-300 MG tablet    Sig: Take 1 tablet by mouth daily.    Dispense:  30 tablet    Refill:  3    Order Specific Question:   Supervising Provider    Answer:   Samuel Bond [[id number]]     Follow-up: Return in about 4 months (around 10/06/2018), or if symptoms worsen or fail to improve.   Samuel Eke, MSN, FNP-C Nurse Practitioner [facility] Office phone: [phone] Pager: [phone] [facility] Main number: [phone]

## 2018-06-06 NOTE — Assessment & Plan Note (Signed)
[name] has well-controlled HIV disease with his current medication regimen with no adverse side effects.  Current viral load remains undetectable.  He is adherent with the medication regimen and has no problems obtaining his medication.  No signs/symptoms of opportunistic infection or progressive HIV disease through history or physical exam.  He has already received his immunizations including his flu shot.  Continue current dose of Triumeq.  Plan for office follow-up in 4 months or sooner if needed with blood work 1 to 2 weeks prior to appointment.

## 2018-07-01 ENCOUNTER — Ambulatory Visit: Payer: BLUE CROSS/BLUE SHIELD | Attending: Family Medicine | Admitting: Family Medicine

## 2018-07-01 ENCOUNTER — Encounter: Payer: Self-pay | Admitting: Family Medicine

## 2018-07-01 VITALS — BP 109/70 | HR 59 | Temp 98.2°F | Resp 18 | Ht 68.0 in | Wt 142.0 lb

## 2018-07-01 DIAGNOSIS — L853 Xerosis cutis: Secondary | ICD-10-CM

## 2018-07-01 DIAGNOSIS — B20 Human immunodeficiency virus [HIV] disease: Secondary | ICD-10-CM | POA: Insufficient documentation

## 2018-07-01 DIAGNOSIS — R739 Hyperglycemia, unspecified: Secondary | ICD-10-CM | POA: Diagnosis not present

## 2018-07-01 DIAGNOSIS — K219 Gastro-esophageal reflux disease without esophagitis: Secondary | ICD-10-CM | POA: Insufficient documentation

## 2018-07-01 LAB — GLUCOSE, POCT (MANUAL RESULT ENTRY): POC Glucose: 89 mg/dL (ref 70–99)

## 2018-07-01 MED ORDER — TRIAMCINOLONE 0.1 % CREAM:EUCERIN CREAM 1:1: 1.0000 "application " | TOPICAL_CREAM | Freq: Three times a day (TID) | CUTANEOUS | 6 refills | Status: DC

## 2018-07-01 NOTE — Progress Notes (Signed)
Subjective:    Patient ID: Samuel Bond, male    DOB: 08/04/1981, 37 y.o.   MRN: [id number]  HPI  37 yo male seen in follow-up of complaint  of multiple joint pain as well as a history of scabies and dry skin on his hands. Patient reports that his dry hands improved when he had the prescribed cream but since running out the cream his hands are now dry again. Patient states that his hands are itchy when he washes them under hot water.      Patient reports that the joint pain and fatigue that he was experiencing at his last visit have now resolved.  Patient states that overall he feels well.  Patient's only complaint at today's visit is regarding the skin on his hands becoming dry and itchy.  Patient states that he does wash his hands multiple times per day.  Patient did not really answer as to the reason why he has to wash his hands multiple times per day.      Patient has had elevated blood sugar of 145 on blood work done in August 2019.  Patient had hemoglobin A1c of 5.5 but his most recent visit on 05/30/2018 and patient requested that his blood sugar be rechecked at today's visit by CMA.  Patient denies any increased thirst, no urinary frequency and no blurred vision.  Past Medical History:  Diagnosis Date  . GERD (gastroesophageal reflux disease)   . HIV disease (HCC)   . Hyperglycemia 10/17/2016  . Maternal HIV infection during pregnancy   . Scabies 05/23/2017  . 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 none     Family History  Problem Relation Age of Onset  . Asthma Father    Social History   Tobacco Use  . Smoking status: Never Smoker  . Smokeless tobacco: Never Used  Substance Use Topics  . Alcohol use: No    Alcohol/week: 0.0 standard drinks  . Drug use: No  No Known Allergies   Review of Systems  Constitutional: Negative for chills, fatigue and fever.  Respiratory: Negative for cough and shortness of breath.   Cardiovascular:  Negative for chest pain, palpitations and leg swelling.  Gastrointestinal: Negative for abdominal pain and nausea.  Genitourinary: Negative for dysuria, flank pain and frequency.  Musculoskeletal: Negative for arthralgias, back pain, gait problem and joint swelling.  Skin: Positive for rash. Negative for wound.  Neurological: Negative for dizziness and headaches.  Hematological: Negative for adenopathy. Does not bruise/bleed easily.       Objective:   Physical Exam BP 109/70 (BP Location: Left Arm, Patient Position: Sitting, Cuff Size: Normal)   Pulse (!) 59   Temp 98.2 F (36.8 C) (Oral)   Resp 18   Ht 5\' 8"  (1.727 m)   Wt 142 lb (64.4 kg)   SpO2 99%   BMI 21.59 kg/m Nurse's notes and vital signs reviewed General- small/thin framed adult male in no acute distress Lungs-clear to auscultation bilaterally Cardiovascular-regular rate and rhythm Abdomen-soft, nontender. Extremities- no edema of the upper or lower extremities, no edema of the hands or fingers Skin- patient with dry skin that is slightly hypopigmented and on the dorsum of both hands down to the wrist.  No palmar skin changes.  No evidence of acute rash.  No erythema.       Assessment & Plan:  1. Dry skin dermatitis Discussed with the patient that he should try to limit  handwashing if possible and should use lotion or moisturizing cream after he washes his hands.  Patient should also avoid the use of hot water and try to wash with cool to warm water as the hot water can be more drying to his skin.  Patient is given refill of combination of triamcinolone and Eucerin as he states that this did help with his dry skin. (Patient denies any issues with anxiety but there is some concern on my part that his dry skin dermatitis may be related to repeated handwashing from OCD type behavior. Patient did not answer when asked why he washes his hands multiple times each day). - Triamcinolone Acetonide (TRIAMCINOLONE 0.1 % CREAM :  EUCERIN) CREA; Apply 1 application topically 3 (three) times daily. As needed for dry skin  Dispense: 1 each; Refill: 6  2. Hyperglycemia Patient did have prior elevated blood sugar to 145 on blood work done on 04/29/2018 by another physician on review of chart.  At patient's most recent visit, patient did have hemoglobin A1c which was within normal at 5.5.  Patient requested that his blood sugar be repeated at today's visit and patient's blood sugar was within normal at 89. - Glucose (CBG)  An After Visit Summary was printed and given to the patient.  Return if symptoms worsen or fail to improve.

## 2018-07-04 ENCOUNTER — Encounter (HOSPITAL_COMMUNITY): Payer: Self-pay

## 2018-07-04 ENCOUNTER — Emergency Department (HOSPITAL_COMMUNITY)
Admission: EM | Admit: 2018-07-04 | Discharge: 2018-07-04 | Disposition: A | Discharge status: Home / Self Care | Payer: BLUE CROSS/BLUE SHIELD | Attending: Emergency Medicine | Admitting: Emergency Medicine

## 2018-07-04 ENCOUNTER — Emergency Department (HOSPITAL_COMMUNITY): Payer: BLUE CROSS/BLUE SHIELD

## 2018-07-04 ENCOUNTER — Other Ambulatory Visit: Payer: Self-pay

## 2018-07-04 DIAGNOSIS — Y939 Activity, unspecified: Secondary | ICD-10-CM | POA: Diagnosis not present

## 2018-07-04 DIAGNOSIS — R51 Headache: Secondary | ICD-10-CM | POA: Diagnosis not present

## 2018-07-04 DIAGNOSIS — Z79899 Other long term (current) drug therapy: Secondary | ICD-10-CM | POA: Diagnosis not present

## 2018-07-04 DIAGNOSIS — Y998 Other external cause status: Secondary | ICD-10-CM | POA: Insufficient documentation

## 2018-07-04 DIAGNOSIS — Y92410 Unspecified street and highway as the place of occurrence of the external cause: Secondary | ICD-10-CM | POA: Insufficient documentation

## 2018-07-04 DIAGNOSIS — B20 Human immunodeficiency virus [HIV] disease: Secondary | ICD-10-CM | POA: Insufficient documentation

## 2018-07-04 MED ORDER — METHOCARBAMOL 500 MG PO TABS: 500.0000 mg | ORAL_TABLET | Freq: Three times a day (TID) | ORAL | 0 refills | Status: AC
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
## 2018-07-04 NOTE — ED Notes (Signed)
Declined W/C at D/C and was escorted to lobby by RN. 

## 2018-07-31 ENCOUNTER — Ambulatory Visit: Payer: BLUE CROSS/BLUE SHIELD | Attending: Family Medicine | Admitting: Family Medicine

## 2018-07-31 ENCOUNTER — Encounter: Payer: Self-pay | Admitting: Family Medicine

## 2018-07-31 VITALS — BP 116/76 | HR 68 | Temp 97.9°F | Resp 16 | Wt 142.2 lb

## 2018-07-31 DIAGNOSIS — M25511 Pain in right shoulder: Secondary | ICD-10-CM | POA: Diagnosis not present

## 2018-07-31 DIAGNOSIS — R05 Cough: Secondary | ICD-10-CM | POA: Diagnosis not present

## 2018-07-31 DIAGNOSIS — Y92410 Unspecified street and highway as the place of occurrence of the external cause: Secondary | ICD-10-CM | POA: Insufficient documentation

## 2018-07-31 DIAGNOSIS — J069 Acute upper respiratory infection, unspecified: Secondary | ICD-10-CM | POA: Diagnosis not present

## 2018-07-31 DIAGNOSIS — S134XXA Sprain of ligaments of cervical spine, initial encounter: Secondary | ICD-10-CM

## 2018-07-31 DIAGNOSIS — K219 Gastro-esophageal reflux disease without esophagitis: Secondary | ICD-10-CM | POA: Insufficient documentation

## 2018-07-31 DIAGNOSIS — M62838 Other muscle spasm: Secondary | ICD-10-CM

## 2018-07-31 DIAGNOSIS — B20 Human immunodeficiency virus [HIV] disease: Secondary | ICD-10-CM | POA: Insufficient documentation

## 2018-07-31 DIAGNOSIS — M4184 Other forms of scoliosis, thoracic region: Secondary | ICD-10-CM | POA: Insufficient documentation

## 2018-07-31 MED ORDER — METHOCARBAMOL 500 MG PO TABS: 500.0000 mg | ORAL_TABLET | Freq: Every evening | ORAL | 1 refills | Status: DC | PRN

## 2018-07-31 MED ORDER — CETIRIZINE HCL 10 MG PO TABS: 10.0000 mg | ORAL_TABLET | Freq: Every day | ORAL | 0 refills | Status: DC

## 2018-07-31 MED ORDER — AMOXICILLIN 875 MG PO TABS: 875.0000 mg | ORAL_TABLET | Freq: Two times a day (BID) | ORAL | 0 refills | Status: AC

## 2018-07-31 MED ORDER — IBUPROFEN 600 MG PO TABS: 600.0000 mg | ORAL_TABLET | Freq: Three times a day (TID) | ORAL | 0 refills | Status: DC | PRN
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
## 2018-07-31 NOTE — Progress Notes (Signed)
Pt states he is also having pain come from his neck

## 2018-08-09 ENCOUNTER — Ambulatory Visit (INDEPENDENT_AMBULATORY_CARE_PROVIDER_SITE_OTHER): Payer: BLUE CROSS/BLUE SHIELD | Admitting: Orthopaedic Surgery

## 2018-08-09 ENCOUNTER — Encounter (INDEPENDENT_AMBULATORY_CARE_PROVIDER_SITE_OTHER): Payer: Self-pay | Admitting: Orthopaedic Surgery

## 2018-08-09 VITALS — Ht 68.0 in | Wt 142.2 lb

## 2018-08-09 DIAGNOSIS — M25511 Pain in right shoulder: Secondary | ICD-10-CM | POA: Diagnosis not present

## 2018-08-09 DIAGNOSIS — M542 Cervicalgia: Secondary | ICD-10-CM | POA: Insufficient documentation

## 2018-08-09 MED ORDER — PREDNISONE 10 MG (21) PO TBPK: ORAL_TABLET | ORAL | 0 refills | Status: DC
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
## 2018-09-24 ENCOUNTER — Other Ambulatory Visit: Payer: BLUE CROSS/BLUE SHIELD

## 2018-10-04 ENCOUNTER — Ambulatory Visit (INDEPENDENT_AMBULATORY_CARE_PROVIDER_SITE_OTHER): Payer: BLUE CROSS/BLUE SHIELD | Admitting: Orthopaedic Surgery

## 2018-10-08 ENCOUNTER — Encounter: Payer: Self-pay | Admitting: Infectious Disease

## 2018-10-08 ENCOUNTER — Ambulatory Visit (INDEPENDENT_AMBULATORY_CARE_PROVIDER_SITE_OTHER): Payer: Self-pay | Admitting: Infectious Disease

## 2018-10-08 VITALS — BP 130/86 | HR 71 | Temp 98.2°F | Wt 142.0 lb

## 2018-10-08 DIAGNOSIS — K5900 Constipation, unspecified: Secondary | ICD-10-CM

## 2018-10-08 DIAGNOSIS — M542 Cervicalgia: Secondary | ICD-10-CM

## 2018-10-08 DIAGNOSIS — Z23 Encounter for immunization: Secondary | ICD-10-CM

## 2018-10-08 DIAGNOSIS — R195 Other fecal abnormalities: Secondary | ICD-10-CM | POA: Insufficient documentation

## 2018-10-08 DIAGNOSIS — B20 Human immunodeficiency virus [HIV] disease: Secondary | ICD-10-CM

## 2018-10-08 HISTORY — DX: Other fecal abnormalities: R19.5

## 2018-10-08 HISTORY — DX: Constipation, unspecified: K59.00

## 2018-10-08 NOTE — Patient Instructions (Signed)
For constipation:  Try taking colace one tablet twice daily  And senna two tablets twice daily  If this does not work try Milk of Magnesia (instructions will be on the bottle)  INcrease fiber in diet and your intake of fluids
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
## 2018-10-09 LAB — T-HELPER CELL (CD4) - (RCID CLINIC ONLY)
CD4 T CELL HELPER: 33 % (ref 33–55)
CD4 T Cell Abs: 420 /uL (ref 400–2700)

## 2018-10-10 ENCOUNTER — Ambulatory Visit (INDEPENDENT_AMBULATORY_CARE_PROVIDER_SITE_OTHER): Payer: Self-pay | Admitting: Orthopaedic Surgery

## 2018-10-10 ENCOUNTER — Encounter (INDEPENDENT_AMBULATORY_CARE_PROVIDER_SITE_OTHER): Payer: Self-pay | Admitting: Orthopaedic Surgery

## 2018-10-10 DIAGNOSIS — M542 Cervicalgia: Secondary | ICD-10-CM

## 2018-10-10 DIAGNOSIS — M25511 Pain in right shoulder: Secondary | ICD-10-CM

## 2018-10-10 LAB — LIPID PANEL
Cholesterol: 145 mg/dL (ref <200)
HDL: 31 mg/dL — ABNORMAL LOW (ref >40)
LDL Cholesterol (Calc): 86 mg/dL (calc)
Non-HDL Cholesterol (Calc): 114 mg/dL (calc) (ref <130)
TRIGLYCERIDES: 189 mg/dL — AB (ref <150)
Total CHOL/HDL Ratio: 4.7 (calc) (ref <5.0)

## 2018-10-10 LAB — CBC WITH DIFFERENTIAL/PLATELET
Absolute Monocytes: 243 cells/uL (ref 200–950)
Basophils Absolute: 22 cells/uL (ref 0–200)
Basophils Relative: 0.4 %
EOS ABS: 70 {cells}/uL (ref 15–500)
Eosinophils Relative: 1.3 %
HCT: 42.4 % (ref 38.5–50.0)
Hemoglobin: 14.7 g/dL (ref 13.2–17.1)
Lymphs Abs: 1291 cells/uL (ref 850–3900)
MCH: 31.3 pg (ref 27.0–33.0)
MCHC: 34.7 g/dL (ref 32.0–36.0)
MCV: 90.2 fL (ref 80.0–100.0)
MPV: 9.5 fL (ref 7.5–12.5)
Monocytes Relative: 4.5 %
NEUTROS PCT: 69.9 %
Neutro Abs: 3775 cells/uL (ref 1500–7800)
PLATELETS: 243 10*3/uL (ref 140–400)
RBC: 4.7 10*6/uL (ref 4.20–5.80)
RDW: 13.1 % (ref 11.0–15.0)
TOTAL LYMPHOCYTE: 23.9 %
WBC: 5.4 10*3/uL (ref 3.8–10.8)

## 2018-10-10 LAB — COMPLETE METABOLIC PANEL WITH GFR
AG Ratio: 1.4 (calc) (ref 1.0–2.5)
ALT: 11 U/L (ref 9–46)
AST: 19 U/L (ref 10–40)
Albumin: 4.3 g/dL (ref 3.6–5.1)
Alkaline phosphatase (APISO): 73 U/L (ref 40–115)
BUN/Creatinine Ratio: 6 (calc) (ref 6–22)
BUN: 5 mg/dL — ABNORMAL LOW (ref 7–25)
CALCIUM: 9.6 mg/dL (ref 8.6–10.3)
CO2: 27 mmol/L (ref 20–32)
Chloride: 107 mmol/L (ref 98–110)
Creat: 0.81 mg/dL (ref 0.60–1.35)
GFR, EST NON AFRICAN AMERICAN: 114 mL/min/{1.73_m2} (ref >=60)
GFR, Est African American: 133 mL/min/{1.73_m2} (ref >=60)
Globulin: 3.1 g/dL (calc) (ref 1.9–3.7)
Glucose, Bld: 113 mg/dL — ABNORMAL HIGH (ref 65–99)
Potassium: 3.7 mmol/L (ref 3.5–5.3)
Sodium: 141 mmol/L (ref 135–146)
Total Bilirubin: 0.7 mg/dL (ref 0.2–1.2)
Total Protein: 7.4 g/dL (ref 6.1–8.1)

## 2018-10-10 LAB — URINE CYTOLOGY ANCILLARY ONLY
Chlamydia: NEGATIVE
NEISSERIA GONORRHEA: NEGATIVE

## 2018-10-10 LAB — RPR: RPR: NONREACTIVE

## 2018-10-10 LAB — HIV-1 RNA QUANT-NO REFLEX-BLD
HIV 1 RNA QUANT: NOT DETECTED {copies}/mL
HIV-1 RNA Quant, Log: <1.3 Log copies/mL
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
## 2018-10-15 ENCOUNTER — Encounter: Payer: Self-pay | Admitting: Infectious Disease

## 2018-11-17 ENCOUNTER — Other Ambulatory Visit: Payer: Self-pay | Admitting: Family

## 2018-11-17 DIAGNOSIS — B20 Human immunodeficiency virus [HIV] disease: Secondary | ICD-10-CM

## 2018-12-05 ENCOUNTER — Ambulatory Visit (INDEPENDENT_AMBULATORY_CARE_PROVIDER_SITE_OTHER): Payer: Self-pay | Admitting: Orthopaedic Surgery

## 2019-01-07 ENCOUNTER — Ambulatory Visit (INDEPENDENT_AMBULATORY_CARE_PROVIDER_SITE_OTHER): Payer: Self-pay | Admitting: Orthopaedic Surgery

## 2019-03-07 ENCOUNTER — Other Ambulatory Visit: Payer: Self-pay | Admitting: Infectious Disease

## 2019-03-07 DIAGNOSIS — B20 Human immunodeficiency virus [HIV] disease: Secondary | ICD-10-CM

## 2019-03-26 ENCOUNTER — Other Ambulatory Visit: Payer: Self-pay

## 2019-04-09 ENCOUNTER — Ambulatory Visit (INDEPENDENT_AMBULATORY_CARE_PROVIDER_SITE_OTHER): Payer: Self-pay | Admitting: Infectious Disease

## 2019-04-09 DIAGNOSIS — K5900 Constipation, unspecified: Secondary | ICD-10-CM

## 2019-04-09 DIAGNOSIS — B20 Human immunodeficiency virus [HIV] disease: Secondary | ICD-10-CM

## 2019-04-09 MED ORDER — TRIUMEQ 600-50-300 MG PO TABS: 1.0000 | ORAL_TABLET | Freq: Every day | ORAL | 11 refills | Status: DC

## 2019-04-09 NOTE — Progress Notes (Signed)
Virtual Visit via Video Note  I connected with Kollen Baha Bracewell on 04/09/19 at  9:00 AM EDT by a video enabled telemedicine application and verified that I am speaking with the correct person using two identifiers.  Location: Patient: Home Provider: My home   I discussed the limitations of evaluation and management by telemedicine and the availability of in person appointments. The patient expressed understanding and agreed to proceed.  History of Present Illness:    This is 38 year old [place] man who is living with HIV that is perfectly controlled on Triumeq.  His wife also has HIV and is a patient in my clinic.  He is doing quite well with no complaints whatsoever.  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  In fact he asked me if his wife to be tested for novel coronavirus 2019.  Apparently several people working at [employer] where his wife works have tested positive for COVID  Observations/Objective:  HIV disease well-controlled  Wife who is working in a high risk environment thus putting the patient himself also at risk  Assessment and Plan:  HIV disease: I had consider changing him to either [place] or [place]ov[place] to get rid of the abacavir.  He is not anxious to make a change at this point in time.  We will reschedule him in 6 months  Risk for COVID-19: I am going to order a COVID-19 test for his wife so that she can be tested I informed him that she will have to be out of work until there is more clarity about the test results  Follow Up Instructions:    I discussed the assessment and treatment plan with the patient. The patient was provided an opportunity to ask questions and all were answered. The patient agreed with the plan and demonstrated an understanding of the instructions.   The patient was advised to call back or seek an in-person evaluation if the symptoms worsen or if the condition fails to improve as  anticipated.  I provided 15 minutes of non-face-to-face time during this encounter.   Alcide Evener, MD

## 2019-04-11 ENCOUNTER — Other Ambulatory Visit: Payer: Self-pay

## 2019-04-11 DIAGNOSIS — Z20822 Contact with and (suspected) exposure to covid-19: Secondary | ICD-10-CM

## 2019-04-14 LAB — NOVEL CORONAVIRUS, NAA: SARS-CoV-2, NAA: NOT DETECTED

## 2019-04-16 ENCOUNTER — Telehealth: Payer: Self-pay | Admitting: Infectious Disease

## 2019-04-16 ENCOUNTER — Other Ambulatory Visit: Payer: Self-pay

## 2019-04-16 ENCOUNTER — Ambulatory Visit: Payer: Self-pay

## 2019-04-16 DIAGNOSIS — B20 Human immunodeficiency virus [HIV] disease: Secondary | ICD-10-CM

## 2019-04-16 NOTE — Telephone Encounter (Signed)
Samuel Bond apparently was in our clinic today meeting with [name] which he absolutely should not have done.  I informed both he and his wife last week when she tested positive for COVID that they were discharged to quarantine in the apartment and not come out into public.  I do not understand why he did not seem to understand that not coming to public included not coming to our clinic.  I asked him to please not leave the apartment at all until he has been notified by me.  I also mentioned to him that coming to the clinic potentially jeopardized both healthcare workers and other clinic patients who could potentially contract COVID from him.  His wife is symptomatic at home and positive and he apparently has a test pending.

## 2019-04-17 LAB — T-HELPER CELL (CD4) - (RCID CLINIC ONLY)
CD4 % Helper T Cell: 34 % (ref 33–65)
CD4 T Cell Abs: 360 /uL — ABNORMAL LOW (ref 400–1790)

## 2019-04-17 LAB — URINE CYTOLOGY ANCILLARY ONLY
Chlamydia: NEGATIVE
Neisseria Gonorrhea: NEGATIVE

## 2019-04-18 ENCOUNTER — Telehealth: Payer: Self-pay | Admitting: Family Medicine

## 2019-04-18 NOTE — Telephone Encounter (Signed)
Patient informed of COVID results °

## 2019-04-24 LAB — CBC WITH DIFFERENTIAL/PLATELET
Absolute Monocytes: 385 cells/uL (ref 200–950)
Basophils Absolute: 20 cells/uL (ref 0–200)
Basophils Relative: 0.4 %
Eosinophils Absolute: 50 cells/uL (ref 15–500)
Eosinophils Relative: 1 %
HCT: 38.8 % (ref 38.5–50.0)
Hemoglobin: 13 g/dL — ABNORMAL LOW (ref 13.2–17.1)
Lymphs Abs: 1090 cells/uL (ref 850–3900)
MCH: 30.9 pg (ref 27.0–33.0)
MCHC: 33.5 g/dL (ref 32.0–36.0)
MCV: 92.2 fL (ref 80.0–100.0)
MPV: 14 fL — ABNORMAL HIGH (ref 7.5–12.5)
Monocytes Relative: 7.7 %
Neutro Abs: 3455 cells/uL (ref 1500–7800)
Neutrophils Relative %: 69.1 %
Platelets: 111 10*3/uL — ABNORMAL LOW (ref 140–400)
RBC: 4.21 10*6/uL (ref 4.20–5.80)
RDW: 14.1 % (ref 11.0–15.0)
Total Lymphocyte: 21.8 %
WBC: 5 10*3/uL (ref 3.8–10.8)

## 2019-04-24 LAB — COMPLETE METABOLIC PANEL WITH GFR
AG Ratio: 1.6 (calc) (ref 1.0–2.5)
ALT: 25 U/L (ref 9–46)
AST: 34 U/L (ref 10–40)
Albumin: 4.2 g/dL (ref 3.6–5.1)
Alkaline phosphatase (APISO): 73 U/L (ref 36–130)
BUN/Creatinine Ratio: 6 (calc) (ref 6–22)
BUN: 5 mg/dL — ABNORMAL LOW (ref 7–25)
CO2: 24 mmol/L (ref 20–32)
Calcium: 9.1 mg/dL (ref 8.6–10.3)
Chloride: 111 mmol/L — ABNORMAL HIGH (ref 98–110)
Creat: 0.81 mg/dL (ref 0.60–1.35)
GFR, Est African American: 132 mL/min/{1.73_m2} (ref >=60)
GFR, Est Non African American: 114 mL/min/{1.73_m2} (ref >=60)
Globulin: 2.6 g/dL (calc) (ref 1.9–3.7)
Glucose, Bld: 90 mg/dL (ref 65–99)
Potassium: 3.9 mmol/L (ref 3.5–5.3)
Sodium: 142 mmol/L (ref 135–146)
Total Bilirubin: 0.7 mg/dL (ref 0.2–1.2)
Total Protein: 6.8 g/dL (ref 6.1–8.1)

## 2019-04-24 LAB — LIPID PANEL
Cholesterol: 110 mg/dL (ref <200)
HDL: 41 mg/dL (ref >=40)
LDL Cholesterol (Calc): 56 mg/dL (calc)
Non-HDL Cholesterol (Calc): 69 mg/dL (calc) (ref <130)
Total CHOL/HDL Ratio: 2.7 (calc) (ref <5.0)
Triglycerides: 51 mg/dL (ref <150)

## 2019-04-24 LAB — HIV-1 RNA QUANT-NO REFLEX-BLD
HIV 1 RNA Quant: <20 copies/mL
HIV-1 RNA Quant, Log: <1.3 Log copies/mL

## 2019-04-24 LAB — RPR: RPR Ser Ql: NONREACTIVE

## 2019-05-15 ENCOUNTER — Ambulatory Visit (INDEPENDENT_AMBULATORY_CARE_PROVIDER_SITE_OTHER): Payer: Self-pay | Admitting: Infectious Disease

## 2019-05-15 ENCOUNTER — Other Ambulatory Visit: Payer: Self-pay

## 2019-05-15 ENCOUNTER — Encounter: Payer: Self-pay | Admitting: Infectious Disease

## 2019-05-15 DIAGNOSIS — B20 Human immunodeficiency virus [HIV] disease: Secondary | ICD-10-CM

## 2019-05-15 MED ORDER — DOVATO 50-300 MG PO TABS: 1.0000 | ORAL_TABLET | Freq: Every day | ORAL | 11 refills | Status: DC

## 2019-05-15 NOTE — Progress Notes (Signed)
Virtual Visit via Video Note  I connected with Samuel Bond on 05/15/19 at 10:30 AM EDT by a video enabled telemedicine application and verified that I am speaking with the correct person using two identifiers.  Location: Patient: Home Provider: My home   I discussed the limitations of evaluation and management by telemedicine and the availability of in person appointments. The patient expressed understanding and agreed to proceed.  History of Present Illness:    This is 38 year old [place] man who is living with HIV that is perfectly controlled on Triumeq.  His wife also has HIV and is a patient in my clinic.  He is doing quite well with no complaints whatsoever.    Landan's wife tested positive recently for COVID-19.  Unfortunately he himself did not observe the quarantine that I asked for her and him to be doing as he apparently showed up in our clinic a few days after her positive test result and had labs drawn and met with [name] to renew his HIV medication assistance program.  I called him subsequently and asked him not to do this again if he is under quarantine.  In any case he tells me now that his wife has been asymptomatic for the last 15 days and has returned to work at a [employer].  He himself is currently unemployed still.  He tells me he himself never developed symptoms the son also did not develop symptoms.  He was interested in coming off of Triumeq and changing to drug that does not involve abacavir and he was interested in Dovato  Observations/Objective:  HIV disease well-controlled  Wife who is working in a high risk environment thus putting the patient himself also at risk  Assessment and Plan:  HIV disease: We will change him to Dovato arrange for follow-up visit for labs and for visit with me    Follow Up Instructions:    I discussed the assessment and treatment plan with the patient. The patient was provided an opportunity to ask questions and all were  answered. The patient agreed with the plan and demonstrated an understanding of the instructions.   The patient was advised to call back or seek an in-person evaluation if the symptoms worsen or if the condition fails to improve as anticipated.  I provided 21 minutes of non-face-to-face time during this encounter.   Alcide Evener, MD

## 2019-06-18 ENCOUNTER — Other Ambulatory Visit: Payer: Self-pay

## 2019-07-09 ENCOUNTER — Ambulatory Visit: Payer: Self-pay | Admitting: Infectious Disease

## 2019-07-09 ENCOUNTER — Other Ambulatory Visit: Payer: Self-pay

## 2019-07-10 ENCOUNTER — Encounter: Payer: Self-pay | Admitting: Infectious Disease

## 2019-07-21 ENCOUNTER — Other Ambulatory Visit: Payer: Self-pay

## 2019-07-21 ENCOUNTER — Encounter: Payer: Self-pay | Admitting: Infectious Disease

## 2019-07-21 ENCOUNTER — Ambulatory Visit (INDEPENDENT_AMBULATORY_CARE_PROVIDER_SITE_OTHER): Payer: Self-pay | Admitting: Infectious Disease

## 2019-07-21 VITALS — BP 136/81 | HR 75 | Temp 97.8°F | Wt 135.0 lb

## 2019-07-21 DIAGNOSIS — D696 Thrombocytopenia, unspecified: Secondary | ICD-10-CM

## 2019-07-21 DIAGNOSIS — B20 Human immunodeficiency virus [HIV] disease: Secondary | ICD-10-CM

## 2019-07-21 DIAGNOSIS — Z20822 Contact with and (suspected) exposure to covid-19: Secondary | ICD-10-CM

## 2019-07-21 DIAGNOSIS — Z23 Encounter for immunization: Secondary | ICD-10-CM

## 2019-07-21 DIAGNOSIS — Z20828 Contact with and (suspected) exposure to other viral communicable diseases: Secondary | ICD-10-CM

## 2019-07-21 NOTE — Progress Notes (Signed)
Chief complaint: constipation Subjective:    Patient ID: Samuel Bond, male    DOB: 01-Jul-1981, 38 y.o.   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 changed over to [facility] perfect virological suppression and healthy immune system with healthy CD4 count.    His wife contracted novel coronavirus 2019.  Himself tested negative for this but I asked for them both to quarantine as documented in prior notes.  His wife is fully recovered and he denies having any symptoms.  In reviewing his labs I did notice that he was thrombocytopenic when he came in for lab testing which is exactly the time that his wife tested positive for Covid.  I wonder if in fact he did have this infection but the PCR did not detect the virus.  I had switched him from Triumeq to Dovato to get rid of the abacavir and we are going to recheck his labs today on Dovato     Past Medical History:  Diagnosis Date  . Change in stool caliber 10/08/2018  . Constipation 10/08/2018  . GERD (gastroesophageal reflux disease)   . HIV disease (HCC)   . Hyperglycemia 10/17/2016  . Maternal HIV infection during pregnancy   . Scabies 05/23/2017  . 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 none     Family History  Problem Relation Age of Onset  . Asthma Father    Social History   Tobacco Use  . Smoking status: Never Smoker  . Smokeless tobacco: Never Used  Substance Use Topics  . Alcohol use: No    Alcohol/week: 0.0 standard drinks  . Drug use: No   No Known Allergies   Current Outpatient Medications:  .  DOVATO 50-300 MG TABS, , Disp: , Rfl:  .  abacavir-dolutegravir-lamiVUDine (TRIUMEQ) 600-50-300 MG tablet, Take 1 tablet by mouth daily. (Patient not taking: Reported on 07/21/2019), Disp: 30 tablet, Rfl: 11 .  cetirizine (ZYRTEC) 10 MG tablet, Take 1 tablet (10 mg total) by mouth daily. Before bedtime as  needed for nasal congestion (Patient not taking: Reported on 07/21/2019), Disp: 30 tablet, Rfl: 0     Review of Systems  Constitutional: Negative for activity change, appetite change, chills, diaphoresis, fatigue, fever and unexpected weight change.  HENT: Negative for congestion, rhinorrhea, sinus pressure, sneezing, sore throat and trouble swallowing.   Eyes: Negative for photophobia and visual disturbance.  Respiratory: Negative for cough, chest tightness, shortness of breath, wheezing and stridor.   Cardiovascular: Negative for chest pain, palpitations and leg swelling.  Gastrointestinal: Positive for constipation. Negative for abdominal distention, abdominal pain, anal bleeding, blood in stool, diarrhea, nausea and vomiting.  Genitourinary: Negative for difficulty urinating, dysuria, flank pain and hematuria.  Musculoskeletal: Negative for arthralgias, back pain, gait problem, joint swelling and myalgias.  Skin: Negative for color change, pallor, rash and wound.  Neurological: Negative for dizziness, tremors, weakness, light-headedness and headaches.  Hematological: Negative for adenopathy. Does not bruise/bleed easily.  Psychiatric/Behavioral: Negative for agitation, behavioral problems, confusion, decreased concentration, dysphoric mood and sleep disturbance.       Objective:   Physical Exam  Constitutional: He is oriented to person, place, and time. He appears well-developed and well-nourished.  HENT:  Head: Normocephalic and atraumatic.  Eyes: Conjunctivae and EOM are normal.  Neck: Normal range of motion. Neck supple.  Cardiovascular: Normal rate and regular rhythm.  Pulmonary/Chest: Effort  normal. No respiratory distress. He has no wheezes.  Abdominal: Soft. He exhibits no distension.  Musculoskeletal: Normal range of motion.        General: No tenderness or edema.  Neurological: He is alert and oriented to person, place, and time.  Skin: Skin is warm and dry. No rash  noted. No erythema. No pallor.  Psychiatric: He has a normal mood and affect. His behavior is normal. Judgment and thought content normal.        Assessment & Plan:  HIV : Continue Dovato. And check labs today. RTC in January to renew HMAP again   Exposure to COVID: check IgG to SARS-CoV2 today  Ttpenia: suspect this was due to [place]

## 2019-07-22 LAB — T-HELPER CELL (CD4) - (RCID CLINIC ONLY)
CD4 % Helper T Cell: 35 % (ref 33–65)
CD4 T Cell Abs: 439 /uL (ref 400–1790)

## 2019-07-25 LAB — CBC WITH DIFFERENTIAL/PLATELET
Absolute Monocytes: 293 cells/uL (ref 200–950)
Basophils Absolute: 33 cells/uL (ref 0–200)
Basophils Relative: 0.5 %
Eosinophils Absolute: 78 cells/uL (ref 15–500)
Eosinophils Relative: 1.2 %
HCT: 40.4 % (ref 38.5–50.0)
Hemoglobin: 13.8 g/dL (ref 13.2–17.1)
Lymphs Abs: 1183 cells/uL (ref 850–3900)
MCH: 30.9 pg (ref 27.0–33.0)
MCHC: 34.2 g/dL (ref 32.0–36.0)
MCV: 90.4 fL (ref 80.0–100.0)
MPV: 9.7 fL (ref 7.5–12.5)
Monocytes Relative: 4.5 %
Neutro Abs: 4914 cells/uL (ref 1500–7800)
Neutrophils Relative %: 75.6 %
Platelets: 229 10*3/uL (ref 140–400)
RBC: 4.47 10*6/uL (ref 4.20–5.80)
RDW: 12.8 % (ref 11.0–15.0)
Total Lymphocyte: 18.2 %
WBC: 6.5 10*3/uL (ref 3.8–10.8)

## 2019-07-25 LAB — HIV-1 RNA QUANT-NO REFLEX-BLD
HIV 1 RNA Quant: <20 copies/mL
HIV-1 RNA Quant, Log: <1.3 Log copies/mL

## 2019-07-25 LAB — SAR COV2 SEROLOGY (COVID19)AB(IGG),IA: SARS CoV2 AB IGG: NEGATIVE
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
## 2019-10-20 ENCOUNTER — Telehealth: Payer: Self-pay

## 2019-10-20 NOTE — Telephone Encounter (Signed)
COVID-19 Pre-Screening Questions:10/20/19  Do you currently have a fever (>100 F), chills or unexplained body aches? NO   Are you currently experiencing new cough, shortness of breath, sore throat, runny nose?NO  Have you recently travelled outside the state of [place] in the last 14 days? NO  .  Have you been in contact with someone that is currently pending confirmation of Covid19 testing or has been confirmed to have the Covid19 virus? NO   **If the patient answers NO to ALL questions -  advise the patient to please call the clinic before coming to the office should any symptoms develop.     

## 2019-10-21 ENCOUNTER — Other Ambulatory Visit: Payer: Self-pay

## 2019-10-21 DIAGNOSIS — B20 Human immunodeficiency virus [HIV] disease: Secondary | ICD-10-CM

## 2019-10-22 LAB — T-HELPER CELL (CD4) - (RCID CLINIC ONLY)
CD4 % Helper T Cell: 36 % (ref 33–65)
CD4 T Cell Abs: 529 /uL (ref 400–1790)

## 2019-10-24 LAB — CBC WITH DIFFERENTIAL/PLATELET
Absolute Monocytes: 340 cells/uL (ref 200–950)
Basophils Absolute: 32 cells/uL (ref 0–200)
Basophils Relative: 0.5 %
Eosinophils Absolute: 69 cells/uL (ref 15–500)
Eosinophils Relative: 1.1 %
HCT: 39.9 % (ref 38.5–50.0)
Hemoglobin: 13.4 g/dL (ref 13.2–17.1)
Lymphs Abs: 1418 cells/uL (ref 850–3900)
MCH: 30 pg (ref 27.0–33.0)
MCHC: 33.6 g/dL (ref 32.0–36.0)
MCV: 89.5 fL (ref 80.0–100.0)
MPV: 10.9 fL (ref 7.5–12.5)
Monocytes Relative: 5.4 %
Neutro Abs: 4442 cells/uL (ref 1500–7800)
Neutrophils Relative %: 70.5 %
Platelets: 235 10*3/uL (ref 140–400)
RBC: 4.46 10*6/uL (ref 4.20–5.80)
RDW: 13 % (ref 11.0–15.0)
Total Lymphocyte: 22.5 %
WBC: 6.3 10*3/uL (ref 3.8–10.8)

## 2019-10-24 LAB — COMPLETE METABOLIC PANEL WITH GFR
AG Ratio: 1.4 (calc) (ref 1.0–2.5)
ALT: 7 U/L — ABNORMAL LOW (ref 9–46)
AST: 17 U/L (ref 10–40)
Albumin: 4.2 g/dL (ref 3.6–5.1)
Alkaline phosphatase (APISO): 78 U/L (ref 36–130)
BUN/Creatinine Ratio: 8 (calc) (ref 6–22)
BUN: 6 mg/dL — ABNORMAL LOW (ref 7–25)
CO2: 27 mmol/L (ref 20–32)
Calcium: 8.9 mg/dL (ref 8.6–10.3)
Chloride: 104 mmol/L (ref 98–110)
Creat: 0.78 mg/dL (ref 0.60–1.35)
GFR, Est African American: 134 mL/min/{1.73_m2} (ref >=60)
GFR, Est Non African American: 115 mL/min/{1.73_m2} (ref >=60)
Globulin: 2.9 g/dL (calc) (ref 1.9–3.7)
Glucose, Bld: 107 mg/dL — ABNORMAL HIGH (ref 65–99)
Potassium: 4.2 mmol/L (ref 3.5–5.3)
Sodium: 138 mmol/L (ref 135–146)
Total Bilirubin: 0.4 mg/dL (ref 0.2–1.2)
Total Protein: 7.1 g/dL (ref 6.1–8.1)

## 2019-10-24 LAB — HIV-1 RNA QUANT-NO REFLEX-BLD
HIV 1 RNA Quant: <20 copies/mL
HIV-1 RNA Quant, Log: <1.3 Log copies/mL

## 2019-10-24 LAB — RPR: RPR Ser Ql: NONREACTIVE

## 2019-11-05 ENCOUNTER — Encounter: Payer: Self-pay | Admitting: Infectious Disease

## 2019-11-20 ENCOUNTER — Ambulatory Visit (INDEPENDENT_AMBULATORY_CARE_PROVIDER_SITE_OTHER): Payer: Self-pay | Admitting: Infectious Disease

## 2019-11-20 ENCOUNTER — Other Ambulatory Visit: Payer: Self-pay

## 2019-11-20 VITALS — BP 128/82 | HR 71 | Wt 135.0 lb

## 2019-11-20 DIAGNOSIS — R739 Hyperglycemia, unspecified: Secondary | ICD-10-CM

## 2019-11-20 DIAGNOSIS — B20 Human immunodeficiency virus [HIV] disease: Secondary | ICD-10-CM

## 2019-11-20 NOTE — Patient Instructions (Signed)
COVID-19 Vaccine Information can be found at: [email or web address] For questions related to vaccine distribution or appointments, please email [email or web address] or call [phone].    

## 2019-11-20 NOTE — Progress Notes (Signed)
Chief complaint: Follow-up for HIV disease Subjective:    Patient ID: Samuel Bond, male    DOB: [birth year], 39 y.o.   MRN: [id number]  HPI  39 year-old [place] man with HIV and LTB whom I saw in June and started on Tivicay and Truvada. changed over to [facility] perfect virological suppression and healthy immune system with healthy CD4 count.    His wife contracted novel coronavirus 2019.  Himself tested negative for this but I asked for them both to quarantine as documented in prior notes.  His wife is fully recovered and he denies having any symptoms.  In reviewing his labs I did notice that he was thrombocytopenic when he came in for lab testing which is exactly the time that his wife tested positive for Covid.  I wondered  if in fact he did have this infection but the PCR did not detect the virus.  I did check a SARS 2 Covid immunoglobulin and this was negative.   I had switched him from Triumeq to Dovato to get rid of the abacavir he is remained suppressed on Dovato.  He is here with his wife and they are renewing HMA P.  He has no new complaints he does continue to work in a [employer] and in my opinion should be given COVID-19 vaccination.  He really has a fear of driving which his wife brought up and I suggested that he can pursue some cognitive behavioral therapy for this if he wants to overcome this fear.  I also offered an SSRI though he was not interested in that.      Past Medical History:  Diagnosis Date  . Change in stool caliber 10/08/2018  . Constipation 10/08/2018  . GERD (gastroesophageal reflux disease)   . HIV disease ([place])   . Hyperglycemia 10/17/2016  . Maternal HIV infection during pregnancy   . Scabies 05/23/2017  . 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 none     Family History  Problem Relation Age of Onset  . Asthma Father    Social History   Tobacco Use  . Smoking status: Never Smoker  . Smokeless  tobacco: Never Used  Substance Use Topics  . Alcohol use: No    Alcohol/week: 0.0 standard drinks  . Drug use: No   No Known Allergies   Current Outpatient Medications:  .  Dolutegravir-lamiVUDine (DOVATO) 50-300 MG TABS, Take 1 tablet by mouth daily., Disp: 30 tablet, Rfl: 11 .  cetirizine (ZYRTEC) 10 MG tablet, Take 1 tablet (10 mg total) by mouth daily. Before bedtime as needed for nasal congestion (Patient not taking: Reported on 07/21/2019), Disp: 30 tablet, Rfl: 0 .  DOVATO 50-300 MG TABS, , Disp: , Rfl:      Review of Systems  Constitutional: Negative for activity change, appetite change, chills, diaphoresis, fatigue, fever and unexpected weight change.  HENT: Negative for congestion, rhinorrhea, sinus pressure, sneezing, sore throat and trouble swallowing.   Eyes: Negative for photophobia and visual disturbance.  Respiratory: Negative for cough, chest tightness, shortness of breath, wheezing and stridor.   Cardiovascular: Negative for chest pain, palpitations and leg swelling.  Gastrointestinal: Negative for abdominal distention, abdominal pain, anal bleeding, blood in stool, diarrhea, nausea and vomiting.  Genitourinary: Negative for difficulty urinating, dysuria, flank pain and hematuria.  Musculoskeletal: Negative for arthralgias, back pain, gait problem, joint swelling and myalgias.  Skin: Negative for color change, pallor, rash and wound.  Neurological: Negative for dizziness,  tremors, weakness, light-headedness and headaches.  Hematological: Negative for adenopathy. Does not bruise/bleed easily.  Psychiatric/Behavioral: Negative for agitation, behavioral problems, confusion, decreased concentration, dysphoric mood and sleep disturbance.       Objective:   Physical Exam  Constitutional: He is oriented to person, place, and time. He appears well-developed and well-nourished.  HENT:  Head: Normocephalic and atraumatic.  Eyes: Conjunctivae and EOM are normal.   Cardiovascular: Normal rate and regular rhythm.  Pulmonary/Chest: Effort normal. No respiratory distress. He has no wheezes.  Abdominal: Soft. He exhibits no distension.  Musculoskeletal:        General: No tenderness, deformity or edema. Normal range of motion.     Cervical back: Normal range of motion and neck supple.  Neurological: He is alert and oriented to person, place, and time.  Skin: Skin is warm and dry. No rash noted. No erythema. No pallor.  Psychiatric: He has a normal mood and affect. His behavior is normal. Judgment and thought content normal.        Assessment & Plan:  HIV : Continue Dovato.  And check them again in July when it is time to renew with this program  Hyperglycemia: We will check an A1c with his next blood test he says eating food the morning of his labs but would like to make sure we are not getting into prediabetic or diabetic range.  His BMI is still in the normal range though.

## 2020-02-17 ENCOUNTER — Encounter: Payer: Self-pay | Admitting: Infectious Disease

## 2020-04-12 ENCOUNTER — Other Ambulatory Visit: Payer: PRIVATE HEALTH INSURANCE

## 2020-04-12 ENCOUNTER — Other Ambulatory Visit: Payer: Self-pay

## 2020-04-12 DIAGNOSIS — B20 Human immunodeficiency virus [HIV] disease: Secondary | ICD-10-CM

## 2020-04-13 ENCOUNTER — Encounter: Payer: Self-pay | Admitting: Infectious Disease

## 2020-04-13 LAB — T-HELPER CELL (CD4) - (RCID CLINIC ONLY)
CD4 % Helper T Cell: 33 % (ref 33–65)
CD4 T Cell Abs: 479 /uL (ref 400–1790)

## 2020-04-16 LAB — COMPLETE METABOLIC PANEL WITH GFR
AG Ratio: 1.5 (calc) (ref 1.0–2.5)
ALT: 15 U/L (ref 9–46)
AST: 30 U/L (ref 10–40)
Albumin: 4.7 g/dL (ref 3.6–5.1)
Alkaline phosphatase (APISO): 94 U/L (ref 36–130)
BUN/Creatinine Ratio: 6 (calc) (ref 6–22)
BUN: 5 mg/dL — ABNORMAL LOW (ref 7–25)
CO2: 24 mmol/L (ref 20–32)
Calcium: 9.4 mg/dL (ref 8.6–10.3)
Chloride: 100 mmol/L (ref 98–110)
Creat: 0.88 mg/dL (ref 0.60–1.35)
GFR, Est African American: 126 mL/min/{1.73_m2} (ref >=60)
GFR, Est Non African American: 109 mL/min/{1.73_m2} (ref >=60)
Globulin: 3.1 g/dL (calc) (ref 1.9–3.7)
Glucose, Bld: 130 mg/dL — ABNORMAL HIGH (ref 65–99)
Potassium: 4.1 mmol/L (ref 3.5–5.3)
Sodium: 136 mmol/L (ref 135–146)
Total Bilirubin: 0.6 mg/dL (ref 0.2–1.2)
Total Protein: 7.8 g/dL (ref 6.1–8.1)

## 2020-04-16 LAB — CBC WITH DIFFERENTIAL/PLATELET
Absolute Monocytes: 262 cells/uL (ref 200–950)
Basophils Absolute: 29 cells/uL (ref 0–200)
Basophils Relative: 0.5 %
Eosinophils Absolute: 120 cells/uL (ref 15–500)
Eosinophils Relative: 2.1 %
HCT: 44.7 % (ref 38.5–50.0)
Hemoglobin: 15 g/dL (ref 13.2–17.1)
Lymphs Abs: 1539 cells/uL (ref 850–3900)
MCH: 30.4 pg (ref 27.0–33.0)
MCHC: 33.6 g/dL (ref 32.0–36.0)
MCV: 90.5 fL (ref 80.0–100.0)
MPV: 11.9 fL (ref 7.5–12.5)
Monocytes Relative: 4.6 %
Neutro Abs: 3751 cells/uL (ref 1500–7800)
Neutrophils Relative %: 65.8 %
Platelets: 206 10*3/uL (ref 140–400)
RBC: 4.94 10*6/uL (ref 4.20–5.80)
RDW: 14 % (ref 11.0–15.0)
Total Lymphocyte: 27 %
WBC: 5.7 10*3/uL (ref 3.8–10.8)

## 2020-04-16 LAB — HEMOGLOBIN A1C
Hgb A1c MFr Bld: 5.5 % of total Hgb (ref <5.7)
Mean Plasma Glucose: 111 (calc)
eAG (mmol/L): 6.2 (calc)

## 2020-04-16 LAB — RPR: RPR Ser Ql: NONREACTIVE

## 2020-04-16 LAB — HIV-1 RNA QUANT-NO REFLEX-BLD
HIV 1 RNA Quant: <20 copies/mL — AB
HIV-1 RNA Quant, Log: <1.3 Log copies/mL — AB

## 2020-04-16 LAB — LIPID PANEL
Cholesterol: 109 mg/dL (ref <200)
HDL: 42 mg/dL (ref >=40)
LDL Cholesterol (Calc): 54 mg/dL (calc)
Non-HDL Cholesterol (Calc): 67 mg/dL (calc) (ref <130)
Total CHOL/HDL Ratio: 2.6 (calc) (ref <5.0)
Triglycerides: 57 mg/dL (ref <150)

## 2020-04-26 ENCOUNTER — Other Ambulatory Visit: Payer: Self-pay

## 2020-04-26 ENCOUNTER — Ambulatory Visit (INDEPENDENT_AMBULATORY_CARE_PROVIDER_SITE_OTHER): Payer: PRIVATE HEALTH INSURANCE | Admitting: Infectious Disease

## 2020-04-26 ENCOUNTER — Encounter: Payer: Self-pay | Admitting: Infectious Disease

## 2020-04-26 VITALS — BP 127/85 | HR 107 | Temp 98.3°F | Wt 126.0 lb

## 2020-04-26 DIAGNOSIS — R5381 Other malaise: Secondary | ICD-10-CM | POA: Diagnosis not present

## 2020-04-26 DIAGNOSIS — B20 Human immunodeficiency virus [HIV] disease: Secondary | ICD-10-CM | POA: Diagnosis not present

## 2020-04-26 HISTORY — DX: Other malaise: R53.81

## 2020-04-26 NOTE — Progress Notes (Signed)
Chief complaint: Follow-up for HIV disease Subjective:    Patient ID: Samuel Bond, male    DOB: March 07, 1981, 38 y.o.   MRN: [id number]  HPI  39 year-old [place] man with HIV and LTB whom I saw in June and started on Tivicay and Truvada. changed over to TRIUMEQ --> Dovato with perfect suppression and healthy immune system with healthy CD4 count.      He is here with his wife and they are renewing HMA P.  He and his wife are concerned about his reduced energy 6 receiving his second dose of COVID-19 vaccination.  Not sure what to make of this.  Weight has been stable.  We also reviewed all of his laboratory data in detail   Past Medical History:  Diagnosis Date  . Change in stool caliber 10/08/2018  . Constipation 10/08/2018  . GERD (gastroesophageal reflux disease)   . HIV disease (HCC)   . Hyperglycemia 10/17/2016  . Maternal HIV infection during pregnancy   . Scabies 05/23/2017  . 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 none     Family History  Problem Relation Age of Onset  . Asthma Father    Social History   Tobacco Use  . Smoking status: Never Smoker  . Smokeless tobacco: Never Used  Substance Use Topics  . Alcohol use: No    Alcohol/week: 0.0 standard drinks  . Drug use: No   No Known Allergies   Current Outpatient Medications:  .  cetirizine (ZYRTEC) 10 MG tablet, Take 1 tablet (10 mg total) by mouth daily. Before bedtime as needed for nasal congestion, Disp: 30 tablet, Rfl: 0 .  Dolutegravir-lamiVUDine (DOVATO) 50-300 MG TABS, Take 1 tablet by mouth daily., Disp: 30 tablet, Rfl: 11 .  DOVATO 50-300 MG TABS, , Disp: , Rfl:      Review of Systems  Constitutional: Negative for activity change, appetite change, chills, diaphoresis, fatigue, fever and unexpected weight change.  HENT: Negative for congestion, rhinorrhea, sinus pressure, sneezing, sore throat and trouble swallowing.   Eyes: Negative for photophobia and  visual disturbance.  Respiratory: Negative for cough, chest tightness, shortness of breath, wheezing and stridor.   Cardiovascular: Negative for chest pain, palpitations and leg swelling.  Gastrointestinal: Negative for abdominal distention, abdominal pain, anal bleeding, blood in stool, diarrhea, nausea and vomiting.  Genitourinary: Negative for difficulty urinating, dysuria, flank pain and hematuria.  Musculoskeletal: Negative for arthralgias, back pain, gait problem, joint swelling and myalgias.  Skin: Negative for color change, pallor, rash and wound.  Neurological: Negative for dizziness, tremors, weakness, light-headedness and headaches.  Hematological: Negative for adenopathy. Does not bruise/bleed easily.  Psychiatric/Behavioral: Negative for agitation, behavioral problems, confusion, decreased concentration, dysphoric mood and sleep disturbance.       Objective:   Physical Exam Constitutional:      Appearance: He is well-developed.  HENT:     Head: Normocephalic and atraumatic.  Eyes:     Conjunctiva/sclera: Conjunctivae normal.  Cardiovascular:     Rate and Rhythm: Normal rate and regular rhythm.  Pulmonary:     Effort: Pulmonary effort is normal. No respiratory distress.     Breath sounds: No wheezing.  Abdominal:     General: There is no distension.     Palpations: Abdomen is soft.  Musculoskeletal:        General: No tenderness or deformity. Normal range of motion.     Cervical back: Normal range of motion and neck supple.  Skin:    General: Skin is warm and dry.     Coloration: Skin is not pale.     Findings: No erythema or rash.  Neurological:     Mental Status: He is alert and oriented to person, place, and time.  Psychiatric:        Behavior: Behavior normal.        Thought Content: Thought content normal.        Judgment: Judgment normal.         Assessment & Plan:  HIV : Continue Dovato.  Well-controlled   Hyperglycemia: A1c was  normal.   Reduced male physical energy: Not sure significance of this.  I think some of this is anxiety driven

## 2020-04-29 ENCOUNTER — Ambulatory Visit: Payer: PRIVATE HEALTH INSURANCE | Attending: Physician Assistant | Admitting: Physician Assistant

## 2020-04-29 ENCOUNTER — Other Ambulatory Visit: Payer: Self-pay

## 2020-04-29 ENCOUNTER — Encounter: Payer: Self-pay | Admitting: Physician Assistant

## 2020-04-29 DIAGNOSIS — M25561 Pain in right knee: Secondary | ICD-10-CM | POA: Diagnosis not present

## 2020-04-29 MED ORDER — NAPROXEN 500 MG PO TABS: 500.0000 mg | ORAL_TABLET | Freq: Two times a day (BID) | ORAL | 0 refills | Status: DC

## 2020-04-29 NOTE — Progress Notes (Signed)
Patient ID: Samuel Bond, male   DOB: [birth year], 39 y.o.   MRN: [id number] Virtual Visit via Telephone Note  I connected with Samuel Bond on 04/29/20 at 10:10 AM EDT by telephone and verified that I am speaking with the correct person using two identifiers.   I discussed the limitations, risks, security and privacy concerns of performing an evaluation and management service by telephone and the availability of in person appointments. I also discussed with the patient that there may be a patient responsible charge related to this service. The patient expressed understanding and agreed to proceed.  PATIENT visit by telephone virtually in the context of Covid-19 pandemic. Patient location: home My Location:  [facility] office Persons on the call:  Me and the patient.     History of Present Illness: Pain in R knee.  Difficulty bending knee.  NKI.  Painful for about 1 month. No swelling.  No redness.  New job since December 2020 and has to stand for long periods of the time.  No redness.  No swelling.  He has not taken any OTC.  No fever.     Observations/Objective: NAD.  A&Ox3  Assessment and Plan: 1. Acute pain of right knee - Ambulatory referral to Orthopedic Surgery - naproxen (NAPROSYN) 500 MG tablet; Take 1 tablet (500 mg total) by mouth 2 (two) times daily with a meal. Prn pain  Dispense: 60 tablet; Refill: 0  Follow Up Instructions: prn   I discussed the assessment and treatment plan with the patient. The patient was provided an opportunity to ask questions and all were answered. The patient agreed with the plan and demonstrated an understanding of the instructions.   The patient was advised to call back or seek an in-person evaluation if the symptoms worsen or if the condition fails to improve as anticipated.  I provided 12 minutes of non-face-to-face time during this encounter.   Georgian Co, PA-C

## 2020-05-12 ENCOUNTER — Ambulatory Visit (INDEPENDENT_AMBULATORY_CARE_PROVIDER_SITE_OTHER): Payer: PRIVATE HEALTH INSURANCE | Admitting: Orthopaedic Surgery

## 2020-05-12 ENCOUNTER — Ambulatory Visit (INDEPENDENT_AMBULATORY_CARE_PROVIDER_SITE_OTHER): Payer: PRIVATE HEALTH INSURANCE

## 2020-05-12 ENCOUNTER — Encounter: Payer: Self-pay | Admitting: Orthopaedic Surgery

## 2020-05-12 DIAGNOSIS — M25561 Pain in right knee: Secondary | ICD-10-CM

## 2020-05-12 DIAGNOSIS — G8929 Other chronic pain: Secondary | ICD-10-CM | POA: Diagnosis not present

## 2020-05-12 NOTE — Progress Notes (Signed)
Office Visit Note   Patient: Samuel Bond           Date of Birth: 05-27-1981           MRN: [id number] Visit Date: 05/12/2020              Requested by: Anders Simmonds, PA-C [id number] [place],  [place] [address] PCP: Cain Saupe, MD   Assessment & Plan: Visit Diagnoses:  1. Chronic pain of right knee     Plan: Impression is right knee osteoarthritis.  After discussion of treatment options patient would like to try compression knee brace as well as Voltaren gel.  He will continue to use naproxen.  He politely declined a cortisone injection.  Follow-up as needed.  Follow-Up Instructions: Return if symptoms worsen or fail to improve.   Orders:  Orders Placed This Encounter  Procedures  . XR KNEE 3 VIEW RIGHT   No orders of the defined types were placed in this encounter.     Procedures: No procedures performed   Clinical Data: No additional findings.   Subjective: Chief Complaint  Patient presents with  . Right Knee - Pain    Patient is a 39 year old gentleman comes in for evaluation of a brand-new problem of right knee pain for the last 6 weeks without any injuries.  He has tried taking naproxen which does help with the pain.  He states that standing and using stairs make the pain worse.  He has pain all over the knee.  Denies any mechanical symptoms.   Review of Systems  Constitutional: Negative.   All other systems reviewed and are negative.    Objective: Vital Signs: There were no vitals taken for this visit.  Physical Exam Vitals and nursing note reviewed.  Constitutional:      Appearance: He is well-developed.  Pulmonary:     Effort: Pulmonary effort is normal.  Abdominal:     Palpations: Abdomen is soft.  Skin:    General: Skin is warm.  Neurological:     Mental Status: He is alert and oriented to person, place, and time.  Psychiatric:        Behavior: Behavior normal.        Thought Content: Thought content normal.         Judgment: Judgment normal.     Ortho Exam Right knee shows no joint effusion.  Minimal crepitus.  No restriction in range of motion.  Collaterals and cruciates are stable. Specialty Comments:  No specialty comments available.  Imaging: XR KNEE 3 VIEW RIGHT  Result Date: 05/12/2020 Mild osteoarthritis without any degenerative changes    PMFS History: Patient Active Problem List   Diagnosis Date Noted  . Malaise 04/26/2020  . Constipation 10/08/2018  . Change in stool caliber 10/08/2018  . Cervicalgia 08/09/2018  . Acute pain of right shoulder 08/09/2018  . Scabies 05/23/2017  . Hyperglycemia 10/17/2016  . HIV disease (HCC) 02/19/2013   Past Medical History:  Diagnosis Date  . Change in stool caliber 10/08/2018  . Constipation 10/08/2018  . GERD (gastroesophageal reflux disease)   . HIV disease (HCC)   . Hyperglycemia 10/17/2016  . Malaise 04/26/2020  . Maternal HIV infection during pregnancy   . Scabies 05/23/2017  . 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 Asthma Father     Past Surgical History:  Procedure Laterality Date  . none     Social History  Occupational History  . Not on file  Tobacco Use  . Smoking status: Never Smoker  . Smokeless tobacco: Never Used  Substance and Sexual Activity  . Alcohol use: No    Alcohol/week: 0.0 standard drinks  . Drug use: No  . Sexual activity: Yes    Partners: Female    Birth control/protection: Condom    Comment: pt declined condoms

## 2020-05-19 ENCOUNTER — Other Ambulatory Visit: Payer: Self-pay

## 2020-05-19 MED ORDER — DOVATO 50-300 MG PO TABS: 1.0000 | ORAL_TABLET | Freq: Every day | ORAL | 5 refills | Status: DC

## 2020-05-30 ENCOUNTER — Other Ambulatory Visit: Payer: Self-pay | Admitting: Physician Assistant

## 2020-05-30 DIAGNOSIS — M25561 Pain in right knee: Secondary | ICD-10-CM

## 2020-07-03 ENCOUNTER — Other Ambulatory Visit: Payer: Self-pay | Admitting: Physician Assistant

## 2020-07-03 DIAGNOSIS — M25561 Pain in right knee: Secondary | ICD-10-CM

## 2020-07-03 NOTE — Telephone Encounter (Signed)
Requested medications are due for refill today yes  Requested medications are on the active medication list yes  Last refill 9/12  Last visit 04/2020  Future visit scheduled no  Notes to clinic acute pain at time, unsure if to be continued, please assess

## 2020-08-10 ENCOUNTER — Other Ambulatory Visit: Payer: Self-pay | Admitting: Family Medicine

## 2020-08-10 DIAGNOSIS — M25561 Pain in right knee: Secondary | ICD-10-CM

## 2020-08-10 NOTE — Telephone Encounter (Signed)
Requested medication (s) are due for refill today: yes  Requested medication (s) are on the active medication list: {yes  Last refill: 07/05/20  #60  0 refills  Future visit scheduled  no  Notes to clinic: Historical provider  Requested Prescriptions  Pending Prescriptions Disp Refills   naproxen (NAPROSYN) 500 MG tablet [Pharmacy Med Name: NAPROXEN 500MG  TABLETS] 60 tablet 2    Sig: TAKE 1 TABLET(500 MG) BY MOUTH TWICE DAILY WITH A MEAL AS NEEDED FOR PAIN      Analgesics:  NSAIDS Passed - 08/10/2020  3:34 AM      Passed - Cr in normal range and within 360 days    Creat  Date Value Ref Range Status  04/12/2020 0.88 0.60 - 1.35 mg/dL Final   Creatinine, Urine  Date Value Ref Range Status  11/19/2017 22 20 - 320 mg/dL Final          Passed - HGB in normal range and within 360 days    Hemoglobin  Date Value Ref Range Status  04/12/2020 15.0 13.2 - 17.1 g/dL Final          Passed - Patient is not pregnant      Passed - Valid encounter within last 12 months    Recent Outpatient Visits           3 months ago Acute pain of right knee   [facility] [place], [place], [place]   2 years ago Acute pain of right shoulder   [facility] [place], [place], [place]   2 years ago Hyperglycemia   [facility] Fulp, [place], [place]   2 years ago Multiple joint pain    [facility] [place], [place], [place]   4 years ago Acute bronchitis, unspecified organism   [facility] [place], [place], [place]

## 2020-09-03 ENCOUNTER — Other Ambulatory Visit: Payer: Self-pay

## 2020-09-03 DIAGNOSIS — Z113 Encounter for screening for infections with a predominantly sexual mode of transmission: Secondary | ICD-10-CM

## 2020-09-03 DIAGNOSIS — B20 Human immunodeficiency virus [HIV] disease: Secondary | ICD-10-CM

## 2020-09-06 ENCOUNTER — Other Ambulatory Visit (HOSPITAL_COMMUNITY)
Admission: RE | Admit: 2020-09-06 | Discharge: 2020-09-06 | Disposition: A | Discharge status: Home / Self Care | Payer: PRIVATE HEALTH INSURANCE | Source: Ambulatory Visit | Attending: Infectious Disease | Admitting: Infectious Disease

## 2020-09-06 ENCOUNTER — Other Ambulatory Visit: Payer: Self-pay

## 2020-09-06 ENCOUNTER — Other Ambulatory Visit: Payer: PRIVATE HEALTH INSURANCE

## 2020-09-06 DIAGNOSIS — Z113 Encounter for screening for infections with a predominantly sexual mode of transmission: Secondary | ICD-10-CM

## 2020-09-06 DIAGNOSIS — B20 Human immunodeficiency virus [HIV] disease: Secondary | ICD-10-CM

## 2020-09-07 LAB — URINE CYTOLOGY ANCILLARY ONLY
Chlamydia: NEGATIVE
Comment: NEGATIVE
Comment: NORMAL
Neisseria Gonorrhea: NEGATIVE

## 2020-09-07 LAB — T-HELPER CELL (CD4) - (RCID CLINIC ONLY)
CD4 % Helper T Cell: 35 % (ref 33–65)
CD4 T Cell Abs: 480 /uL (ref 400–1790)

## 2020-09-11 LAB — COMPLETE METABOLIC PANEL WITH GFR
AG Ratio: 1.5 (calc) (ref 1.0–2.5)
ALT: 19 U/L (ref 9–46)
AST: 36 U/L (ref 10–40)
Albumin: 4.6 g/dL (ref 3.6–5.1)
Alkaline phosphatase (APISO): 89 U/L (ref 36–130)
BUN: 8 mg/dL (ref 7–25)
CO2: 29 mmol/L (ref 20–32)
Calcium: 9.4 mg/dL (ref 8.6–10.3)
Chloride: 104 mmol/L (ref 98–110)
Creat: 0.92 mg/dL (ref 0.60–1.35)
GFR, Est African American: 122 mL/min/{1.73_m2} (ref >=60)
GFR, Est Non African American: 105 mL/min/{1.73_m2} (ref >=60)
Globulin: 3 g/dL (calc) (ref 1.9–3.7)
Glucose, Bld: 104 mg/dL — ABNORMAL HIGH (ref 65–99)
Potassium: 4.2 mmol/L (ref 3.5–5.3)
Sodium: 139 mmol/L (ref 135–146)
Total Bilirubin: 0.8 mg/dL (ref 0.2–1.2)
Total Protein: 7.6 g/dL (ref 6.1–8.1)

## 2020-09-11 LAB — HIV-1 RNA QUANT-NO REFLEX-BLD
HIV 1 RNA Quant: <20 Copies/mL
HIV-1 RNA Quant, Log: <1.3 Log cps/mL

## 2020-09-11 LAB — CBC WITH DIFFERENTIAL/PLATELET
Absolute Monocytes: 342 cells/uL (ref 200–950)
Basophils Absolute: 30 cells/uL (ref 0–200)
Basophils Relative: 0.5 %
Eosinophils Absolute: 102 cells/uL (ref 15–500)
Eosinophils Relative: 1.7 %
HCT: 42.3 % (ref 38.5–50.0)
Hemoglobin: 14.4 g/dL (ref 13.2–17.1)
Lymphs Abs: 1290 cells/uL (ref 850–3900)
MCH: 31.4 pg (ref 27.0–33.0)
MCHC: 34 g/dL (ref 32.0–36.0)
MCV: 92.2 fL (ref 80.0–100.0)
MPV: 12.5 fL (ref 7.5–12.5)
Monocytes Relative: 5.7 %
Neutro Abs: 4236 cells/uL (ref 1500–7800)
Neutrophils Relative %: 70.6 %
Platelets: 172 10*3/uL (ref 140–400)
RBC: 4.59 10*6/uL (ref 4.20–5.80)
RDW: 13.6 % (ref 11.0–15.0)
Total Lymphocyte: 21.5 %
WBC: 6 10*3/uL (ref 3.8–10.8)

## 2020-09-15 ENCOUNTER — Other Ambulatory Visit: Payer: Self-pay | Admitting: Family Medicine

## 2020-09-15 DIAGNOSIS — M25561 Pain in right knee: Secondary | ICD-10-CM

## 2020-09-19 ENCOUNTER — Encounter (HOSPITAL_COMMUNITY): Payer: Self-pay

## 2020-09-19 ENCOUNTER — Other Ambulatory Visit: Payer: Self-pay

## 2020-09-19 ENCOUNTER — Ambulatory Visit (HOSPITAL_COMMUNITY)
Admission: EM | Admit: 2020-09-19 | Discharge: 2020-09-19 | Disposition: A | Discharge status: Home / Self Care | Payer: PRIVATE HEALTH INSURANCE | Attending: Family Medicine | Admitting: Family Medicine

## 2020-09-19 DIAGNOSIS — K219 Gastro-esophageal reflux disease without esophagitis: Secondary | ICD-10-CM | POA: Insufficient documentation

## 2020-09-19 DIAGNOSIS — B20 Human immunodeficiency virus [HIV] disease: Secondary | ICD-10-CM | POA: Diagnosis not present

## 2020-09-19 DIAGNOSIS — U071 COVID-19: Secondary | ICD-10-CM | POA: Diagnosis not present

## 2020-09-19 DIAGNOSIS — K5900 Constipation, unspecified: Secondary | ICD-10-CM | POA: Insufficient documentation

## 2020-09-19 DIAGNOSIS — Z791 Long term (current) use of non-steroidal anti-inflammatories (NSAID): Secondary | ICD-10-CM | POA: Insufficient documentation

## 2020-09-19 DIAGNOSIS — J069 Acute upper respiratory infection, unspecified: Secondary | ICD-10-CM | POA: Diagnosis not present

## 2020-09-19 DIAGNOSIS — Z79899 Other long term (current) drug therapy: Secondary | ICD-10-CM | POA: Insufficient documentation

## 2020-09-19 DIAGNOSIS — R051 Acute cough: Secondary | ICD-10-CM | POA: Diagnosis present

## 2020-09-19 LAB — RESP PANEL BY RT-PCR (FLU A&B, COVID) ARPGX2
Influenza A by PCR: NEGATIVE
Influenza B by PCR: NEGATIVE
SARS Coronavirus 2 by RT PCR: POSITIVE — AB

## 2020-09-19 MED ORDER — ACETAMINOPHEN 325 MG PO TABS
ORAL_TABLET | ORAL | Status: AC
  Filled 2020-09-19: qty 2

## 2020-09-19 MED ORDER — ACETAMINOPHEN 325 MG PO TABS
650.0000 mg | ORAL_TABLET | Freq: Once | ORAL | Status: AC
  Administered 2020-09-19: 650 mg via ORAL

## 2020-09-19 MED ORDER — NAPROXEN 500 MG PO TABS: 500.0000 mg | ORAL_TABLET | Freq: Two times a day (BID) | ORAL | 0 refills | Status: DC

## 2020-09-19 MED ORDER — LIDOCAINE VISCOUS HCL 2 % MT SOLN: 15.0000 mL | OROMUCOSAL | 0 refills | Status: DC | PRN

## 2020-09-19 NOTE — ED Triage Notes (Signed)
Pt in with c/o headache, fever of 100, and cough that has been going on for 2 days now  Pt has tried tylenol with no relief

## 2020-09-19 NOTE — ED Provider Notes (Signed)
[facility]    CSN: [id number] Arrival date & time: 09/19/20  1541      History   Chief Complaint Chief Complaint  Patient presents with  . Fever  . Cough  . Headache    HPI Samuel Bond is a 40 y.o. male Presenting for URI symptoms for 2 days. History of GERD, constipation, HIV. Fevers 100, headache 7/10 relieved with tylenol, cough x2 days. Tylenol providing minimal relief.  Denies  n/v/d, shortness of breath, chest pain, , facial pain, teeth pain, loss of taste/smell, swollen lymph nodes, ear pain.  Denies worst headache of life, thunderclap headache, weakness/sensation changes in arms/legs, vision changes, shortness of breath, chest pain/pressure, photophobia, phonophobia, n/v/d.  Denies chest pain, shortness of breath, confusion, high fevers.  Fully vaccinated for covid-19.   HPI  Past Medical History:  Diagnosis Date  . Change in stool caliber 10/08/2018  . Constipation 10/08/2018  . GERD (gastroesophageal reflux disease)   . HIV disease (HCC)   . Hyperglycemia 10/17/2016  . Malaise 04/26/2020  . Maternal HIV infection during pregnancy   . Scabies 05/23/2017  . 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 Malaise 04/26/2020  . Constipation 10/08/2018  . Change in stool caliber 10/08/2018  . Cervicalgia 08/09/2018  . Acute pain of right shoulder 08/09/2018  . Scabies 05/23/2017  . Hyperglycemia 10/17/2016  . HIV disease (HCC) 02/19/2013    Past Surgical History:  Procedure Laterality Date  . none         Home Medications    Prior to Admission medications   Medication Sig Start Date End Date Taking? Authorizing Provider  lidocaine (XYLOCAINE) 2 % solution Use as directed 15 mLs in the mouth or throat as needed for mouth pain. 09/19/20  Yes Rhys Martini, PA-C  naproxen (NAPROSYN) 500 MG tablet Take 1 tablet (500 mg total) by mouth 2 (two) times daily. 09/19/20  Yes Rhys Martini, PA-C  cetirizine  (ZYRTEC) 10 MG tablet Take 1 tablet (10 mg total) by mouth daily. Before bedtime as needed for nasal congestion 07/31/18   Fulp, Cammie, MD  Dolutegravir-lamiVUDine (DOVATO) 50-300 MG TABS Take 1 tablet by mouth daily. 05/19/20 06/18/20  Randall Hiss, MD    Family History Family History  Problem Relation Age of Onset  . Asthma Father     Social History Social History   Tobacco Use  . Smoking status: Never Smoker  . Smokeless tobacco: Never Used  Substance Use Topics  . Alcohol use: No    Alcohol/week: 0.0 standard drinks  . Drug use: No     Allergies   Patient has no known allergies.   Review of Systems Review of Systems  Constitutional: Negative for appetite change, chills and fever.  HENT: Negative for congestion, ear pain, rhinorrhea, sinus pressure, sinus pain and sore throat.   Eyes: Negative for redness and visual disturbance.  Respiratory: Positive for cough. Negative for chest tightness, shortness of breath and wheezing.   Cardiovascular: Negative for chest pain and palpitations.  Gastrointestinal: Negative for abdominal pain, constipation, diarrhea, nausea and vomiting.  Genitourinary: Negative for dysuria, frequency and urgency.  Musculoskeletal: Negative for myalgias.  Neurological: Positive for headaches. Negative for dizziness and weakness.  Psychiatric/Behavioral: Negative for confusion.  All other systems reviewed and are negative.    Physical Exam Triage Vital Signs ED Triage Vitals  Enc Vitals Group     BP 09/19/20 1716  101/75     Pulse Rate 09/19/20 1716 (!) 103     Resp 09/19/20 1718 20     Temp 09/19/20 1716 (!) 101.5 F (38.6 C)     Temp Source 09/19/20 1716 Oral     SpO2 09/19/20 1716 98 %     Weight --      Height --      Head Circumference --      Peak Flow --      Pain Score 09/19/20 1715 8     Pain Loc --      Pain Edu? --      Excl. in GC? --    No data found.  Updated Vital Signs BP 101/75 (BP Location: Right Arm)    Pulse (!) 103   Temp (!) 101.5 F (38.6 C) (Oral)   Resp 20   SpO2 98%   Visual Acuity Right Eye Distance:   Left Eye Distance:   Bilateral Distance:    Right Eye Near:   Left Eye Near:    Bilateral Near:     Physical Exam Vitals reviewed.  Constitutional:      General: He is not in acute distress.    Appearance: Normal appearance. He is not ill-appearing.  HENT:     Head: Normocephalic and atraumatic.     Right Ear: Hearing, tympanic membrane, ear canal and external ear normal. No swelling or tenderness. There is no impacted cerumen. No mastoid tenderness. Tympanic membrane is not perforated, erythematous, retracted or bulging.     Left Ear: Hearing, tympanic membrane, ear canal and external ear normal. No swelling or tenderness. There is no impacted cerumen. No mastoid tenderness. Tympanic membrane is not perforated, erythematous, retracted or bulging.     Nose:     Right Sinus: No maxillary sinus tenderness or frontal sinus tenderness.     Left Sinus: No maxillary sinus tenderness or frontal sinus tenderness.     Mouth/Throat:     Mouth: Mucous membranes are moist.     Pharynx: Uvula midline. Posterior oropharyngeal erythema present. No oropharyngeal exudate.     Tonsils: No tonsillar exudate.  Cardiovascular:     Rate and Rhythm: Normal rate and regular rhythm.     Heart sounds: Normal heart sounds.  Pulmonary:     Breath sounds: Normal breath sounds and air entry. No wheezing, rhonchi or rales.  Chest:     Chest wall: No tenderness.  Abdominal:     General: Abdomen is flat. Bowel sounds are normal.     Tenderness: There is no abdominal tenderness. There is no guarding or rebound.  Lymphadenopathy:     Cervical: No cervical adenopathy.  Neurological:     General: No focal deficit present.     Mental Status: He is alert and oriented to person, place, and time.  Psychiatric:        Attention and Perception: Attention and perception normal.        Mood and Affect:  Mood and affect normal.        Behavior: Behavior normal. Behavior is cooperative.        Thought Content: Thought content normal.        Judgment: Judgment normal.      UC Treatments / Results  Labs (all labs ordered are listed, but only abnormal results are displayed) Labs Reviewed  RESP PANEL BY RT-PCR (FLU A&B, COVID) [id number]    EKG   Radiology No results found.  Procedures Procedures (including critical care  time)  Medications Ordered in UC Medications  acetaminophen (TYLENOL) tablet 650 mg (650 mg Oral Given 09/19/20 1719)    Initial Impression / Assessment and Plan / UC Course  I have reviewed the triage vital signs and the nursing notes.  Pertinent labs & imaging results that were available during my care of the patient were reviewed by me and considered in my medical decision making (see chart for details).     Covid and influenza tests sent today. Patient is fully vaccinated for covid-19. Isolation precautions per CDC guidelines until negative result. Symptomatic relief with OTC Mucinex, Nyquil, etc. Return precautions- new/worsening fevers/chills, shortness of breath, chest pain, abd pain, etc.   -For sore throat, use lidocaine mouthwash up to every 4 hours. Make sure not to eat for at least 1 hour after using this, as your mouth will be very numb and you could bite yourself. -Naproxen up to 2 pills daily for pain. You can also continue using tylenol.  We are currently awaiting result of your PCR covid-19 test. Please isolate at home while awaiting these results. If your test is positive for Covid-19, continue to isolate at home for a total of 10 days from symptom onset. Treat your symptoms at home with OTC remedies like tylenol/ibuprofen, mucinex, nyquil, etc. Seek medical attention if you develop high fevers, chest pain, shortness of breath, ear pain, facial pain, etc. Make sure to get up and move around every 2-3 hours while convalescing to help prevent blood  clots. Drink plenty of fluids, and rest as much as possible.  Final Clinical Impressions(s) / UC Diagnoses   Final diagnoses:  Acute upper respiratory infection     Discharge Instructions       -For sore throat, use lidocaine mouthwash up to every 4 hours. Make sure not to eat for at least 1 hour after using this, as your mouth will be very numb and you could bite yourself. -Naproxen up to 2 pills daily for pain. You can also continue using tylenol.  We are currently awaiting result of your PCR covid-19 test. Please isolate at home while awaiting these results. If your test is positive for Covid-19, continue to isolate at home for a total of 10 days from symptom onset. Treat your symptoms at home with OTC remedies like tylenol/ibuprofen, mucinex, nyquil, etc. Seek medical attention if you develop high fevers, chest pain, shortness of breath, ear pain, facial pain, etc. Make sure to get up and move around every 2-3 hours while convalescing to help prevent blood clots. Drink plenty of fluids, and rest as much as possible.     ED Prescriptions    Medication Sig Dispense Auth. Provider   lidocaine (XYLOCAINE) 2 % solution Use as directed 15 mLs in the mouth or throat as needed for mouth pain. 100 mL Hazel Sams, PA-C   naproxen (NAPROSYN) 500 MG tablet Take 1 tablet (500 mg total) by mouth 2 (two) times daily. 30 tablet Hazel Sams, PA-C     PDMP not reviewed this encounter.   Hazel Sams, PA-C 09/19/20 1905

## 2020-09-19 NOTE — Discharge Instructions (Addendum)
° °-  For sore throat, use lidocaine mouthwash up to every 4 hours. Make sure not to eat for at least 1 hour after using this, as your mouth will be very numb and you could bite yourself. -Naproxen up to 2 pills daily for pain. You can also continue using tylenol.  We are currently awaiting result of your PCR covid-19 test. Please isolate at home while awaiting these results. If your test is positive for Covid-19, continue to isolate at home for a total of 10 days from symptom onset. Treat your symptoms at home with OTC remedies like tylenol/ibuprofen, mucinex, nyquil, etc. Seek medical attention if you develop high fevers, chest pain, shortness of breath, ear pain, facial pain, etc. Make sure to get up and move around every 2-3 hours while convalescing to help prevent blood clots. Drink plenty of fluids, and rest as much as possible.

## 2020-09-20 ENCOUNTER — Telehealth (HOSPITAL_COMMUNITY): Payer: Self-pay

## 2020-09-20 NOTE — Telephone Encounter (Signed)
Called to discuss with patient about COVID-19 symptoms and the use of one of the available treatments for those with mild to moderate Covid symptoms and at a high risk of hospitalization.     Pt appears to qualify for this infusion due to co-morbid conditions and/or a member of an at-risk group in accordance with the FDA Emergency Use Authorization.     Unable to reach pt - No VM set up to leave message. Will send MyChart message.   Angelia Mould

## 2020-09-21 ENCOUNTER — Encounter: Payer: PRIVATE HEALTH INSURANCE | Admitting: Infectious Disease

## 2020-09-21 ENCOUNTER — Ambulatory Visit: Payer: PRIVATE HEALTH INSURANCE

## 2020-10-25 ENCOUNTER — Encounter: Payer: Self-pay | Admitting: Infectious Disease

## 2020-10-25 ENCOUNTER — Ambulatory Visit (INDEPENDENT_AMBULATORY_CARE_PROVIDER_SITE_OTHER): Payer: PRIVATE HEALTH INSURANCE | Admitting: Infectious Disease

## 2020-10-25 ENCOUNTER — Other Ambulatory Visit: Payer: Self-pay

## 2020-10-25 ENCOUNTER — Ambulatory Visit: Payer: PRIVATE HEALTH INSURANCE

## 2020-10-25 VITALS — BP 128/81 | HR 76 | Temp 97.4°F | Resp 16 | Ht 68.0 in | Wt 133.4 lb

## 2020-10-25 DIAGNOSIS — B20 Human immunodeficiency virus [HIV] disease: Secondary | ICD-10-CM

## 2020-10-25 DIAGNOSIS — U071 COVID-19: Secondary | ICD-10-CM | POA: Insufficient documentation

## 2020-10-25 DIAGNOSIS — Z8616 Personal history of COVID-19: Secondary | ICD-10-CM | POA: Diagnosis not present

## 2020-10-25 DIAGNOSIS — M1711 Unilateral primary osteoarthritis, right knee: Secondary | ICD-10-CM | POA: Diagnosis not present

## 2020-10-25 DIAGNOSIS — Z23 Encounter for immunization: Secondary | ICD-10-CM | POA: Diagnosis not present

## 2020-10-25 HISTORY — DX: Unilateral primary osteoarthritis, right knee: M17.11

## 2020-10-25 HISTORY — DX: COVID-19: U07.1

## 2020-10-25 NOTE — Progress Notes (Signed)
Chief complaint: Follow-up for HIV disease Subjective:    Patient ID: Samuel Bond, male    DOB: 18-May-1981, 40 y.o.   MRN: [id number]  HPI  40 year-old [place] man with HIV and LTB who went from  [place] and [place]. to [facility] --> Dovato with perfect suppression and healthy immune system with healthy CD4 count.      He is here with his wife and they are renewing PCAP  Once I last saw him he has seen Dr. Roda Shutters for neck knee pain.  He has been diagnosed with some osteoarthritis prescribed Voltaren gel and a brace he declined corticosteroid injection.  He also recently developed fever and a headache and tested positive for COVID-19 he recovered from his illness on 10 January.  He has had to [employer] vaccines but no booster  Past Medical History:  Diagnosis Date  . Change in stool caliber 10/08/2018  . Constipation 10/08/2018  . COVID-19 virus infection 10/25/2020  . GERD (gastroesophageal reflux disease)   . HIV disease (HCC)   . Hyperglycemia 10/17/2016  . Malaise 04/26/2020  . Maternal HIV infection during pregnancy   . Scabies 05/23/2017  . 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 none     Family History  Problem Relation Age of Onset  . Asthma Father    Social History   Tobacco Use  . Smoking status: Never Smoker  . Smokeless tobacco: Never Used  Substance Use Topics  . Alcohol use: No    Alcohol/week: 0.0 standard drinks  . Drug use: No   No Known Allergies   Current Outpatient Medications:  .  Dolutegravir-lamiVUDine (DOVATO) 50-300 MG TABS, Take 1 tablet by mouth daily., Disp: 30 tablet, Rfl: 5     Review of Systems  Constitutional: Negative for activity change, appetite change, chills, diaphoresis, fatigue, fever and unexpected weight change.  HENT: Negative for congestion, rhinorrhea, sinus pressure, sneezing, sore throat and trouble swallowing.   Eyes: Negative for photophobia and visual disturbance.  Respiratory:  Negative for cough, chest tightness, shortness of breath, wheezing and stridor.   Cardiovascular: Negative for chest pain, palpitations and leg swelling.  Gastrointestinal: Negative for abdominal distention, abdominal pain, anal bleeding, blood in stool, diarrhea, nausea and vomiting.  Genitourinary: Negative for difficulty urinating, dysuria, flank pain and hematuria.  Musculoskeletal: Negative for arthralgias, back pain, gait problem, joint swelling and myalgias.  Skin: Negative for color change, pallor, rash and wound.  Neurological: Negative for dizziness, tremors, weakness, light-headedness and headaches.  Hematological: Negative for adenopathy. Does not bruise/bleed easily.  Psychiatric/Behavioral: Negative for agitation, behavioral problems, confusion, decreased concentration, dysphoric mood and sleep disturbance.       Objective:   Physical Exam Constitutional:      Appearance: He is well-developed.  HENT:     Head: Normocephalic and atraumatic.  Eyes:     Conjunctiva/sclera: Conjunctivae normal.  Cardiovascular:     Rate and Rhythm: Normal rate and regular rhythm.  Pulmonary:     Effort: Pulmonary effort is normal. No respiratory distress.     Breath sounds: No wheezing.  Abdominal:     General: There is no distension.     Palpations: Abdomen is soft.  Musculoskeletal:        General: No tenderness or deformity. Normal range of motion.     Cervical back: Normal range of motion and neck supple.  Skin:    General: Skin is warm and dry.  Coloration: Skin is not pale.     Findings: No erythema or rash.  Neurological:     General: No focal deficit present.     Mental Status: He is alert and oriented to person, place, and time.  Psychiatric:        Mood and Affect: Mood normal.        Behavior: Behavior normal.        Thought Content: Thought content normal.        Judgment: Judgment normal.         Assessment & Plan:  HIV : HIV perfectly controlled continue  Dovato return to clinic in July for PCA P renewal  Osteoarthritis of knee: Can follow-up with Dr. Roda Shutters if needed  COVID prevention: Will receive his booster today

## 2020-11-14 ENCOUNTER — Other Ambulatory Visit: Payer: Self-pay | Admitting: Infectious Disease

## 2020-11-16 ENCOUNTER — Other Ambulatory Visit: Payer: Self-pay | Admitting: Physician Assistant

## 2020-11-16 DIAGNOSIS — M25561 Pain in right knee: Secondary | ICD-10-CM

## 2021-01-11 ENCOUNTER — Encounter: Payer: Self-pay | Admitting: Infectious Disease

## 2021-04-25 ENCOUNTER — Other Ambulatory Visit: Payer: Self-pay

## 2021-04-25 ENCOUNTER — Ambulatory Visit (INDEPENDENT_AMBULATORY_CARE_PROVIDER_SITE_OTHER): Payer: No Typology Code available for payment source | Admitting: Infectious Disease

## 2021-04-25 ENCOUNTER — Encounter: Payer: Self-pay | Admitting: Infectious Disease

## 2021-04-25 ENCOUNTER — Other Ambulatory Visit (HOSPITAL_COMMUNITY)
Admission: RE | Admit: 2021-04-25 | Discharge: 2021-04-25 | Disposition: A | Discharge status: Home / Self Care | Payer: No Typology Code available for payment source | Source: Ambulatory Visit | Attending: Infectious Disease | Admitting: Infectious Disease

## 2021-04-25 ENCOUNTER — Ambulatory Visit: Payer: No Typology Code available for payment source

## 2021-04-25 VITALS — BP 125/82 | HR 65 | Wt 132.6 lb

## 2021-04-25 DIAGNOSIS — U071 COVID-19: Secondary | ICD-10-CM | POA: Diagnosis not present

## 2021-04-25 DIAGNOSIS — B20 Human immunodeficiency virus [HIV] disease: Secondary | ICD-10-CM | POA: Insufficient documentation

## 2021-04-25 DIAGNOSIS — M25512 Pain in left shoulder: Secondary | ICD-10-CM

## 2021-04-25 DIAGNOSIS — M1711 Unilateral primary osteoarthritis, right knee: Secondary | ICD-10-CM

## 2021-04-25 DIAGNOSIS — S46812A Strain of other muscles, fascia and tendons at shoulder and upper arm level, left arm, initial encounter: Secondary | ICD-10-CM | POA: Diagnosis not present

## 2021-04-25 DIAGNOSIS — S46819A Strain of other muscles, fascia and tendons at shoulder and upper arm level, unspecified arm, initial encounter: Secondary | ICD-10-CM

## 2021-04-25 DIAGNOSIS — M25519 Pain in unspecified shoulder: Secondary | ICD-10-CM | POA: Insufficient documentation

## 2021-04-25 DIAGNOSIS — M542 Cervicalgia: Secondary | ICD-10-CM

## 2021-04-25 HISTORY — DX: Cervicalgia: M54.2

## 2021-04-25 HISTORY — DX: Strain of other muscles, fascia and tendons at shoulder and upper arm level, unspecified arm, initial encounter: S46.819A

## 2021-04-25 HISTORY — DX: Pain in unspecified shoulder: M25.519

## 2021-04-25 MED ORDER — DOVATO 50-300 MG PO TABS: 1.0000 | ORAL_TABLET | Freq: Every day | ORAL | 11 refills | Status: DC

## 2021-04-25 NOTE — Progress Notes (Signed)
Chief complaint: Pain in left side of neck and in shoulder especially when turning his head to 1 side Subjective:    Patient ID: Samuel Bond, male    DOB: 03/05/1981, 40 y.o.   MRN: [id number]  HPI  40 year old [place] man living with HIV that is been perfectly controlled most recently on Dovato.  He has a history of latent tuberculosis that was treated.  He had COVID-19 infection in January of this year.  He presents to clinic for routine follow-up with his wife.  He is complaining of a new symptom of pain in his neck radiating to his shoulder with turning of his head to the left at times.  Lucianne Lei is about -5/10. And is alleviated by positional change. It not accompanied by any numbness. He does not recall a specific injury. It has been going on for 5 days  He had complaints in the past also of some neck pain but he says that was a different complaint and was related to an accident.    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  HENT:  Negative for congestion and sore throat.   Eyes:  Negative for photophobia.  Respiratory:  Negative for cough, shortness of breath and wheezing.   Cardiovascular:  Negative for chest pain, palpitations and leg swelling.  Gastrointestinal:  Negative for abdominal pain, blood in stool, constipation, diarrhea, nausea and vomiting.  Genitourinary:  Negative for dysuria, flank pain and hematuria.  Musculoskeletal:  Negative for arthralgias, back pain and myalgias.  Skin:  Negative for rash.  Neurological:  Negative for dizziness, weakness and headaches.  Hematological:  Does not bruise/bleed easily.  Psychiatric/Behavioral:  Negative for agitation, behavioral problems, confusion, decreased concentration, dysphoric mood, hallucinations, self-injury and suicidal ideas. The patient is not nervous/anxious and is not hyperactive.       Objective:   Physical Exam Constitutional:      Appearance: He is well-developed.  HENT:     Head: Normocephalic and atraumatic.  Eyes:     Extraocular Movements: Extraocular movements intact.     Conjunctiva/sclera: Conjunctivae normal.  Cardiovascular:     Rate and Rhythm: Normal rate and regular rhythm.  Pulmonary:     Effort: Pulmonary effort is normal. No respiratory distress.     Breath sounds: No wheezing.  Abdominal:     General: There is no distension.     Palpations:  Abdomen is soft.  Musculoskeletal:        General: No tenderness. Normal range of motion.     Left shoulder: Normal. No swelling, deformity, effusion, laceration or bony tenderness. Normal range of motion.     Cervical back: Normal range of motion and neck supple.  Skin:    General: Skin is warm and dry.     Coloration: Skin is not pale.     Findings: No erythema or rash.  Neurological:     General: No focal deficit present.     Mental Status: He is alert and oriented to person, place, and time.  Psychiatric:        Mood and Affect: Mood normal.        Behavior:  Behavior normal.        Thought Content: Thought content normal.        Judgment: Judgment normal.        Assessment & Plan:  Left neck and shoulder pain: Suspect this is a trapezius muscle strain: I have given him some names of physical therapy practices and also happy to refer him to 1 if he needs to I suspect this will resolve on its own.  HIV disease:  I have renewed his prescription for Dovato and sent that into his mail order pharmacy in [place].  I am checking a viral load and CD4 count today.  I am checking a CMP and CBC with differential, lipid panel   COVID 19: He has recovered.  Osteoarthritis of right knee no longer having pain

## 2021-04-25 NOTE — Patient Instructions (Signed)
[facility]  [address] [place], [place]  [phone]  (This is where I go to Physical THerapy)  Another one close to this office is :  [facility] at [facility]  Address: [address] [place], [place], [place] [address]  Open ? Closes 6PM Phone: [phone]

## 2021-04-26 LAB — URINE CYTOLOGY ANCILLARY ONLY
Chlamydia: NEGATIVE
Comment: NEGATIVE
Comment: NORMAL
Neisseria Gonorrhea: NEGATIVE

## 2021-04-27 LAB — CBC WITH DIFFERENTIAL/PLATELET
Absolute Monocytes: 239 cells/uL (ref 200–950)
Basophils Absolute: 29 cells/uL (ref 0–200)
Basophils Relative: 0.5 %
Eosinophils Absolute: 51 cells/uL (ref 15–500)
Eosinophils Relative: 0.9 %
HCT: 42 % (ref 38.5–50.0)
Hemoglobin: 14.1 g/dL (ref 13.2–17.1)
Lymphs Abs: 935 cells/uL (ref 850–3900)
MCH: 30.8 pg (ref 27.0–33.0)
MCHC: 33.6 g/dL (ref 32.0–36.0)
MCV: 91.7 fL (ref 80.0–100.0)
MPV: 12 fL (ref 7.5–12.5)
Monocytes Relative: 4.2 %
Neutro Abs: 4446 cells/uL (ref 1500–7800)
Neutrophils Relative %: 78 %
Platelets: 185 10*3/uL (ref 140–400)
RBC: 4.58 10*6/uL (ref 4.20–5.80)
RDW: 13.6 % (ref 11.0–15.0)
Total Lymphocyte: 16.4 %
WBC: 5.7 10*3/uL (ref 3.8–10.8)

## 2021-04-27 LAB — RPR: RPR Ser Ql: NONREACTIVE

## 2021-04-27 LAB — LIPID PANEL
Cholesterol: 142 mg/dL (ref <200)
HDL: 44 mg/dL (ref >=40)
LDL Cholesterol (Calc): 83 mg/dL (calc)
Non-HDL Cholesterol (Calc): 98 mg/dL (calc) (ref <130)
Total CHOL/HDL Ratio: 3.2 (calc) (ref <5.0)
Triglycerides: 71 mg/dL (ref <150)

## 2021-04-27 LAB — COMPLETE METABOLIC PANEL WITH GFR
AG Ratio: 1.4 (calc) (ref 1.0–2.5)
ALT: 15 U/L (ref 9–46)
AST: 29 U/L (ref 10–40)
Albumin: 4.5 g/dL (ref 3.6–5.1)
Alkaline phosphatase (APISO): 84 U/L (ref 36–130)
BUN/Creatinine Ratio: 7 (calc) (ref 6–22)
BUN: 5 mg/dL — ABNORMAL LOW (ref 7–25)
CO2: 29 mmol/L (ref 20–32)
Calcium: 9.8 mg/dL (ref 8.6–10.3)
Chloride: 106 mmol/L (ref 98–110)
Creat: 0.75 mg/dL (ref 0.60–1.26)
Globulin: 3.2 g/dL (calc) (ref 1.9–3.7)
Glucose, Bld: 89 mg/dL (ref 65–99)
Potassium: 4.3 mmol/L (ref 3.5–5.3)
Sodium: 141 mmol/L (ref 135–146)
Total Bilirubin: 0.8 mg/dL (ref 0.2–1.2)
Total Protein: 7.7 g/dL (ref 6.1–8.1)
eGFR: 118 mL/min/{1.73_m2} (ref >=60)

## 2021-04-27 LAB — VITAMIN D 1,25 DIHYDROXY
Vitamin D 1, 25 (OH)2 Total: 48 pg/mL (ref 18–72)
Vitamin D2 1, 25 (OH)2: <8 pg/mL
Vitamin D3 1, 25 (OH)2: 48 pg/mL

## 2021-04-27 LAB — T-HELPER CELL (CD4) - (RCID CLINIC ONLY)
CD4 % Helper T Cell: 39 % (ref 33–65)
CD4 T Cell Abs: 330 /uL — ABNORMAL LOW (ref 400–1790)

## 2021-04-27 LAB — HIV-1 RNA QUANT-NO REFLEX-BLD
HIV 1 RNA Quant: <20 Copies/mL — ABNORMAL HIGH
HIV-1 RNA Quant, Log: <1.3 Log cps/mL — ABNORMAL HIGH

## 2021-04-27 LAB — VITAMIN D 25 HYDROXY (VIT D DEFICIENCY, FRACTURES): Vit D, 25-Hydroxy: 33 ng/mL (ref 30–100)

## 2021-07-14 ENCOUNTER — Ambulatory Visit: Payer: No Typology Code available for payment source

## 2021-07-14 ENCOUNTER — Other Ambulatory Visit: Payer: Self-pay

## 2021-08-26 ENCOUNTER — Encounter: Payer: Self-pay | Admitting: Infectious Disease

## 2021-10-31 ENCOUNTER — Other Ambulatory Visit: Payer: Self-pay

## 2021-10-31 ENCOUNTER — Ambulatory Visit: Payer: Self-pay

## 2021-10-31 ENCOUNTER — Ambulatory Visit: Payer: Self-pay | Admitting: Infectious Disease

## 2021-10-31 ENCOUNTER — Other Ambulatory Visit (HOSPITAL_COMMUNITY): Payer: Self-pay

## 2021-11-03 ENCOUNTER — Other Ambulatory Visit: Payer: Self-pay | Admitting: Pharmacist

## 2021-11-03 DIAGNOSIS — B20 Human immunodeficiency virus [HIV] disease: Secondary | ICD-10-CM

## 2021-11-03 MED ORDER — DOVATO 50-300 MG PO TABS: 1.0000 | ORAL_TABLET | Freq: Every day | ORAL | 0 refills | Status: DC

## 2021-11-03 NOTE — Progress Notes (Signed)
Medication Samples have been provided to the patient.  Drug name: Dovato        Strength: 50/300 mg         Qty: 14 tablets (1 bottle)   LOT: [id number]   Exp.Date: 02/17/23  Dosing instructions: Take one tablet by mouth once daily  The patient has been instructed regarding the correct time, dose, and frequency of taking this medication, including desired effects and most common side effects.   Unnamed Zeien L. Jannette Fogo, PharmD, BCIDP, AAHIVP, CPP Clinical Pharmacist Practitioner Infectious Diseases Clinical Pharmacist [facility] 08/30/2020, 10:07 AM

## 2021-11-14 NOTE — Progress Notes (Signed)
Chief complaint: Follow-up for HIV disease on medications Subjective:    Patient ID: Samuel Bond, male    DOB: 05/05/1981, 41 y.o.   MRN: [id number]  HPI  41 year old [place] man living with HIV that is been perfectly controlled most recently on Dovato.  He has a history of latent tuberculosis that was treated.  At his last visit with me he was complaining of neck pain and shoulder pain.  This is now resolved he also is complaining of pain in his right knee which is also now resolved.      Past Medical History:  Diagnosis Date   Change in stool caliber 10/08/2018   Constipation 10/08/2018   COVID-19 virus infection 10/25/2020   GERD (gastroesophageal reflux disease)    HIV disease (HCC)    Hyperglycemia 10/17/2016   Malaise 04/26/2020   Maternal HIV infection during pregnancy    Neck pain 04/25/2021   Osteoarthritis of right knee 10/25/2020   Scabies 05/23/2017   Shoulder pain 04/25/2021   TB lung, latent 11/04/2014   Trapezius muscle strain 04/25/2021   Past Surgical History:  Procedure Laterality Date   none     Family History  Problem Relation Age of Onset   Asthma Father    Social History   Tobacco Use   Smoking status: Never   Smokeless tobacco: Never  Substance Use Topics   Alcohol use: No    Alcohol/week: 0.0 standard drinks   Drug use: No   No Known Allergies   Current Outpatient Medications:    dolutegravir-lamiVUDine (DOVATO) 50-300 MG tablet, Take 1 tablet by mouth daily for 14 days., Disp: 14 tablet, Rfl: 0   Dolutegravir-lamiVUDine (DOVATO) 50-300 MG TABS, Take 1 tablet by mouth daily., Disp: 30 tablet, Rfl: 11     Review of Systems  Constitutional:  Negative for activity change, appetite change, chills, diaphoresis, fatigue, fever and unexpected weight change.  HENT:  Negative for congestion, rhinorrhea, sinus pressure, sneezing, sore throat and trouble swallowing.   Eyes:  Negative for photophobia and visual disturbance.  Respiratory:  Negative for  cough, chest tightness, shortness of breath, wheezing and stridor.   Cardiovascular:  Negative for chest pain, palpitations and leg swelling.  Gastrointestinal:  Negative for abdominal distention, abdominal pain, anal bleeding, blood in stool, constipation, diarrhea, nausea and vomiting.  Genitourinary:  Negative for difficulty urinating, dysuria, flank pain and hematuria.  Musculoskeletal:  Negative for arthralgias, back pain, gait problem, joint swelling and myalgias.  Skin:  Negative for color change, pallor, rash and wound.  Neurological:  Negative for dizziness, tremors, weakness, light-headedness and headaches.  Hematological:  Negative for adenopathy. Does not bruise/bleed easily.  Psychiatric/Behavioral:  Negative for agitation, behavioral problems, confusion, decreased concentration, dysphoric mood, sleep disturbance and suicidal ideas.       Objective:   Physical Exam Constitutional:      Appearance: He is well-developed.  HENT:     Head: Normocephalic and atraumatic.  Eyes:     Conjunctiva/sclera: Conjunctivae normal.  Cardiovascular:     Rate and Rhythm: Normal rate and regular rhythm.  Pulmonary:     Effort: Pulmonary effort is normal. No respiratory distress.     Breath sounds: No wheezing.  Abdominal:     General: There is no distension.     Palpations: Abdomen is soft.  Musculoskeletal:        General: No tenderness. Normal range of motion.     Cervical back: Normal range of motion and neck supple.  Skin:  General: Skin is warm and dry.     Coloration: Skin is not pale.     Findings: No erythema or rash.  Neurological:     General: No focal deficit present.     Mental Status: He is alert and oriented to person, place, and time.  Psychiatric:        Mood and Affect: Mood normal.        Behavior: Behavior normal.        Thought Content: Thought content normal.        Judgment: Judgment normal.        Assessment & Plan:   HIV disease: I will check viral  load CD4 CBC with differential CMP  I will continue his Dovato     Osteoarthritis of right knee no longer having pain    Shoulder and neck pain: resolved   Vaccine counseling: recommended Prevnar 20

## 2021-11-15 ENCOUNTER — Other Ambulatory Visit: Payer: Self-pay

## 2021-11-15 ENCOUNTER — Ambulatory Visit (INDEPENDENT_AMBULATORY_CARE_PROVIDER_SITE_OTHER): Payer: Self-pay | Admitting: Infectious Disease

## 2021-11-15 DIAGNOSIS — Z23 Encounter for immunization: Secondary | ICD-10-CM

## 2021-11-15 DIAGNOSIS — M25511 Pain in right shoulder: Secondary | ICD-10-CM

## 2021-11-15 DIAGNOSIS — B20 Human immunodeficiency virus [HIV] disease: Secondary | ICD-10-CM

## 2021-11-15 DIAGNOSIS — M1711 Unilateral primary osteoarthritis, right knee: Secondary | ICD-10-CM

## 2021-11-15 MED ORDER — DOVATO 50-300 MG PO TABS: 1.0000 | ORAL_TABLET | Freq: Every day | ORAL | 11 refills | Status: DC

## 2021-11-17 LAB — CBC WITH DIFFERENTIAL/PLATELET
Absolute Monocytes: 278 cells/uL (ref 200–950)
Basophils Absolute: 17 cells/uL (ref 0–200)
Basophils Relative: 0.3 %
Eosinophils Absolute: 93 cells/uL (ref 15–500)
Eosinophils Relative: 1.6 %
HCT: 45.3 % (ref 38.5–50.0)
Hemoglobin: 15.2 g/dL (ref 13.2–17.1)
Lymphs Abs: 1421 cells/uL (ref 850–3900)
MCH: 30.8 pg (ref 27.0–33.0)
MCHC: 33.6 g/dL (ref 32.0–36.0)
MCV: 91.7 fL (ref 80.0–100.0)
MPV: 10.3 fL (ref 7.5–12.5)
Monocytes Relative: 4.8 %
Neutro Abs: 3990 cells/uL (ref 1500–7800)
Neutrophils Relative %: 68.8 %
Platelets: 299 10*3/uL (ref 140–400)
RBC: 4.94 10*6/uL (ref 4.20–5.80)
RDW: 12.8 % (ref 11.0–15.0)
Total Lymphocyte: 24.5 %
WBC: 5.8 10*3/uL (ref 3.8–10.8)

## 2021-11-17 LAB — COMPLETE METABOLIC PANEL WITH GFR
AG Ratio: 1.5 (calc) (ref 1.0–2.5)
ALT: 19 U/L (ref 9–46)
AST: 29 U/L (ref 10–40)
Albumin: 4.5 g/dL (ref 3.6–5.1)
Alkaline phosphatase (APISO): 79 U/L (ref 36–130)
BUN/Creatinine Ratio: 5 (calc) — ABNORMAL LOW (ref 6–22)
BUN: 4 mg/dL — ABNORMAL LOW (ref 7–25)
CO2: 27 mmol/L (ref 20–32)
Calcium: 9.4 mg/dL (ref 8.6–10.3)
Chloride: 106 mmol/L (ref 98–110)
Creat: 0.79 mg/dL (ref 0.60–1.26)
Globulin: 3 g/dL (calc) (ref 1.9–3.7)
Glucose, Bld: 90 mg/dL (ref 65–99)
Potassium: 4.1 mmol/L (ref 3.5–5.3)
Sodium: 141 mmol/L (ref 135–146)
Total Bilirubin: 0.4 mg/dL (ref 0.2–1.2)
Total Protein: 7.5 g/dL (ref 6.1–8.1)
eGFR: 116 mL/min/{1.73_m2} (ref >=60)

## 2021-11-17 LAB — HIV-1 RNA QUANT-NO REFLEX-BLD
HIV 1 RNA Quant: NOT DETECTED Copies/mL
HIV-1 RNA Quant, Log: NOT DETECTED Log cps/mL

## 2021-11-17 LAB — T-HELPER CELLS (CD4) COUNT (NOT AT ARMC)
Absolute CD4: 495 cells/uL (ref 490–1740)
CD4 T Helper %: 36 % (ref 30–61)
Total lymphocyte count: 1380 cells/uL (ref 850–3900)

## 2021-11-17 LAB — LIPID PANEL
Cholesterol: 136 mg/dL (ref <200)
HDL: 48 mg/dL (ref >=40)
LDL Cholesterol (Calc): 71 mg/dL (calc)
Non-HDL Cholesterol (Calc): 88 mg/dL (calc) (ref <130)
Total CHOL/HDL Ratio: 2.8 (calc) (ref <5.0)
Triglycerides: 90 mg/dL (ref <150)

## 2021-11-17 LAB — C. TRACHOMATIS/N. GONORRHOEAE RNA
C. trachomatis RNA, TMA: NOT DETECTED
N. gonorrhoeae RNA, TMA: NOT DETECTED

## 2021-11-17 LAB — RPR: RPR Ser Ql: NONREACTIVE

## 2022-04-11 ENCOUNTER — Ambulatory Visit: Payer: Self-pay | Admitting: Infectious Disease

## 2022-05-08 ENCOUNTER — Other Ambulatory Visit: Payer: Self-pay

## 2022-05-08 ENCOUNTER — Ambulatory Visit: Payer: Self-pay

## 2022-05-08 ENCOUNTER — Ambulatory Visit (INDEPENDENT_AMBULATORY_CARE_PROVIDER_SITE_OTHER): Payer: Self-pay | Admitting: Infectious Disease

## 2022-05-08 ENCOUNTER — Encounter: Payer: Self-pay | Admitting: Infectious Disease

## 2022-05-08 VITALS — BP 126/84 | HR 84 | Resp 16 | Ht 68.0 in | Wt 132.0 lb

## 2022-05-08 DIAGNOSIS — B20 Human immunodeficiency virus [HIV] disease: Secondary | ICD-10-CM

## 2022-05-08 DIAGNOSIS — K219 Gastro-esophageal reflux disease without esophagitis: Secondary | ICD-10-CM

## 2022-05-08 DIAGNOSIS — E785 Hyperlipidemia, unspecified: Secondary | ICD-10-CM

## 2022-05-08 MED ORDER — ATORVASTATIN CALCIUM 20 MG PO TABS: 20.0000 mg | ORAL_TABLET | Freq: Every day | ORAL | 11 refills | Status: DC

## 2022-05-08 MED ORDER — DOVATO 50-300 MG PO TABS: 1.0000 | ORAL_TABLET | Freq: Every day | ORAL | 11 refills | Status: DC

## 2022-05-08 NOTE — Progress Notes (Signed)
Chief complaint: followup for HIV disease on medications Subjective:    Patient ID: Samuel Bond, male    DOB: Oct 04, 1980, 41 y.o.   MRN: [id number]  HPI  41 year-old [place] man living with HIV that is been perfectly controlled most recently on Dovato.  He has a history of latent tuberculosis that was treated.       Past Medical History:  Diagnosis Date   Change in stool caliber 10/08/2018   Constipation 10/08/2018   COVID-19 virus infection 10/25/2020   GERD (gastroesophageal reflux disease)    HIV disease (HCC)    Hyperglycemia 10/17/2016   Malaise 04/26/2020   Maternal HIV infection during pregnancy    Neck pain 04/25/2021   Osteoarthritis of right knee 10/25/2020   Scabies 05/23/2017   Shoulder pain 04/25/2021   TB lung, latent 11/04/2014   Trapezius muscle strain 04/25/2021   Past Surgical History:  Procedure Laterality Date   none     Family History  Problem Relation Age of Onset   Asthma Father    Social History   Tobacco Use   Smoking status: Never   Smokeless tobacco: Never  Substance Use Topics   Alcohol use: No    Alcohol/week: 0.0 standard drinks of alcohol   Drug use: No   No Known Allergies   Current Outpatient Medications:    dolutegravir-lamiVUDine (DOVATO) 50-300 MG tablet, Take 1 tablet by mouth daily., Disp: 30 tablet, Rfl: 11     Review of Systems  Constitutional:  Negative for activity change, appetite change, chills, diaphoresis, fatigue, fever and unexpected weight change.  HENT:  Negative for congestion, rhinorrhea, sinus pressure, sneezing, sore throat and trouble swallowing.   Eyes:  Negative for photophobia and visual disturbance.  Respiratory:  Negative for cough, chest tightness, shortness of breath, wheezing and stridor.   Cardiovascular:  Negative for chest pain, palpitations and leg swelling.  Gastrointestinal:  Negative for abdominal distention, abdominal pain, anal bleeding, blood in stool, constipation, diarrhea, nausea and  vomiting.  Genitourinary:  Negative for difficulty urinating, dysuria, flank pain and hematuria.  Musculoskeletal:  Negative for arthralgias, back pain, gait problem, joint swelling and myalgias.  Skin:  Negative for color change, pallor, rash and wound.  Neurological:  Negative for dizziness, tremors, weakness and light-headedness.  Hematological:  Negative for adenopathy. Does not bruise/bleed easily.  Psychiatric/Behavioral:  Negative for agitation, behavioral problems, confusion, decreased concentration, dysphoric mood and sleep disturbance.        Objective:   Physical Exam Constitutional:      Appearance: He is well-developed.  HENT:     Head: Normocephalic and atraumatic.  Eyes:     Conjunctiva/sclera: Conjunctivae normal.  Cardiovascular:     Rate and Rhythm: Normal rate and regular rhythm.  Pulmonary:     Effort: Pulmonary effort is normal. No respiratory distress.     Breath sounds: No wheezing.  Abdominal:     General: There is no distension.     Palpations: Abdomen is soft.  Musculoskeletal:        General: No tenderness. Normal range of motion.     Cervical back: Normal range of motion and neck supple.  Skin:    General: Skin is warm and dry.     Coloration: Skin is not pale.     Findings: No erythema or rash.  Neurological:     General: No focal deficit present.     Mental Status: He is alert and oriented to person, place, and time.  Psychiatric:  Mood and Affect: Mood normal.        Behavior: Behavior normal.        Thought Content: Thought content normal.        Judgment: Judgment normal.         Assessment & Plan:   HIV disease:  I am rechecking a viral load CD4 CBC with differential CMP with GFR  I am continuing his Dovato prescription  Hyperlipidemia cardiovascular prevention based on reprieve data I am going to initiate a statin I tried to prescribe [place] statin but the EMR is saying it is not on formularly (does he actually have  insurance?  I therefore rx atorvastatin and I am checking lipid panel

## 2022-05-09 LAB — T-HELPER CELLS (CD4) COUNT (NOT AT ARMC)
CD4 % Helper T Cell: 37 % (ref 33–65)
CD4 T Cell Abs: 394 /uL — ABNORMAL LOW (ref 400–1790)

## 2022-05-09 LAB — URINE CYTOLOGY ANCILLARY ONLY
Chlamydia: NEGATIVE
Comment: NEGATIVE
Comment: NORMAL
Neisseria Gonorrhea: NEGATIVE

## 2022-05-11 LAB — COMPLETE METABOLIC PANEL WITH GFR
AG Ratio: 1.4 (calc) (ref 1.0–2.5)
ALT: 12 U/L (ref 9–46)
AST: 26 U/L (ref 10–40)
Albumin: 4.5 g/dL (ref 3.6–5.1)
Alkaline phosphatase (APISO): 83 U/L (ref 36–130)
BUN/Creatinine Ratio: 6 (calc) (ref 6–22)
BUN: 5 mg/dL — ABNORMAL LOW (ref 7–25)
CO2: 24 mmol/L (ref 20–32)
Calcium: 9.8 mg/dL (ref 8.6–10.3)
Chloride: 105 mmol/L (ref 98–110)
Creat: 0.82 mg/dL (ref 0.60–1.29)
Globulin: 3.2 g/dL (calc) (ref 1.9–3.7)
Glucose, Bld: 92 mg/dL (ref 65–99)
Potassium: 3.8 mmol/L (ref 3.5–5.3)
Sodium: 139 mmol/L (ref 135–146)
Total Bilirubin: 0.5 mg/dL (ref 0.2–1.2)
Total Protein: 7.7 g/dL (ref 6.1–8.1)
eGFR: 114 mL/min/{1.73_m2} (ref >=60)

## 2022-05-11 LAB — LIPID PANEL
Cholesterol: 131 mg/dL (ref <200)
HDL: 42 mg/dL (ref >=40)
LDL Cholesterol (Calc): 74 mg/dL (calc)
Non-HDL Cholesterol (Calc): 89 mg/dL (calc) (ref <130)
Total CHOL/HDL Ratio: 3.1 (calc) (ref <5.0)
Triglycerides: 66 mg/dL (ref <150)

## 2022-05-11 LAB — CBC WITH DIFFERENTIAL/PLATELET
Absolute Monocytes: 250 cells/uL (ref 200–950)
Basophils Absolute: 31 cells/uL (ref 0–200)
Basophils Relative: 0.5 %
Eosinophils Absolute: 79 cells/uL (ref 15–500)
Eosinophils Relative: 1.3 %
HCT: 41.5 % (ref 38.5–50.0)
Hemoglobin: 14.1 g/dL (ref 13.2–17.1)
Lymphs Abs: 1122 cells/uL (ref 850–3900)
MCH: 31.5 pg (ref 27.0–33.0)
MCHC: 34 g/dL (ref 32.0–36.0)
MCV: 92.8 fL (ref 80.0–100.0)
MPV: 10.2 fL (ref 7.5–12.5)
Monocytes Relative: 4.1 %
Neutro Abs: 4618 cells/uL (ref 1500–7800)
Neutrophils Relative %: 75.7 %
Platelets: 262 10*3/uL (ref 140–400)
RBC: 4.47 10*6/uL (ref 4.20–5.80)
RDW: 13.1 % (ref 11.0–15.0)
Total Lymphocyte: 18.4 %
WBC: 6.1 10*3/uL (ref 3.8–10.8)

## 2022-05-11 LAB — RPR: RPR Ser Ql: NONREACTIVE

## 2022-05-11 LAB — HIV-1 RNA QUANT-NO REFLEX-BLD
HIV 1 RNA Quant: NOT DETECTED Copies/mL
HIV-1 RNA Quant, Log: NOT DETECTED Log cps/mL

## 2022-10-12 ENCOUNTER — Ambulatory Visit: Payer: Self-pay | Admitting: Infectious Disease

## 2022-10-31 ENCOUNTER — Ambulatory Visit (INDEPENDENT_AMBULATORY_CARE_PROVIDER_SITE_OTHER): Payer: Self-pay

## 2022-10-31 ENCOUNTER — Encounter: Payer: Self-pay | Admitting: Infectious Disease

## 2022-10-31 ENCOUNTER — Ambulatory Visit: Payer: Self-pay | Admitting: Infectious Disease

## 2022-10-31 ENCOUNTER — Other Ambulatory Visit: Payer: Self-pay

## 2022-10-31 VITALS — BP 122/79 | HR 82 | Temp 97.4°F | Ht 68.0 in | Wt 131.0 lb

## 2022-10-31 DIAGNOSIS — Z7185 Encounter for immunization safety counseling: Secondary | ICD-10-CM

## 2022-10-31 DIAGNOSIS — B20 Human immunodeficiency virus [HIV] disease: Secondary | ICD-10-CM

## 2022-10-31 DIAGNOSIS — Z227 Latent tuberculosis: Secondary | ICD-10-CM

## 2022-10-31 DIAGNOSIS — Z23 Encounter for immunization: Secondary | ICD-10-CM

## 2022-10-31 DIAGNOSIS — E785 Hyperlipidemia, unspecified: Secondary | ICD-10-CM

## 2022-10-31 HISTORY — DX: Hyperlipidemia, unspecified: E78.5

## 2022-10-31 MED ORDER — DOVATO 50-300 MG PO TABS: 1.0000 | ORAL_TABLET | Freq: Every day | ORAL | 11 refills | Status: DC

## 2022-10-31 MED ORDER — ATORVASTATIN CALCIUM 20 MG PO TABS: 20.0000 mg | ORAL_TABLET | Freq: Every day | ORAL | 11 refills | Status: DC

## 2022-10-31 NOTE — Progress Notes (Signed)
Chief complaint: Follow-up for HIV disease on medications s   Patient ID: Samuel Bond, male    DOB: May 01, 1981, 42 y.o.   MRN: [id number]  HPI  41 year-old [place] man living with HIV that is been perfectly controlled most recently on Dovato.  He has a history of latent tuberculosis that was treated.  Atorvastatin to attenuate his cardiovascular risk.       Past Medical History:  Diagnosis Date   Change in stool caliber 10/08/2018   Constipation 10/08/2018   COVID-19 virus infection 10/25/2020   GERD (gastroesophageal reflux disease)    HIV disease (HCC)    Hyperglycemia 10/17/2016   Malaise 04/26/2020   Maternal HIV infection during pregnancy    Neck pain 04/25/2021   Osteoarthritis of right knee 10/25/2020   Scabies 05/23/2017   Shoulder pain 04/25/2021   TB lung, latent 11/04/2014   Trapezius muscle strain 04/25/2021   Past Surgical History:  Procedure Laterality Date   none     Family History  Problem Relation Age of Onset   Asthma Father    Social History   Tobacco Use   Smoking status: Never   Smokeless tobacco: Never  Substance Use Topics   Alcohol use: No    Alcohol/week: 0.0 standard drinks of alcohol   Drug use: No   No Known Allergies   Current Outpatient Medications:    atorvastatin (LIPITOR) 20 MG tablet, Take 1 tablet (20 mg total) by mouth daily., Disp: 30 tablet, Rfl: 11   dolutegravir-lamiVUDine (DOVATO) 50-300 MG tablet, Take 1 tablet by mouth daily., Disp: 30 tablet, Rfl: 11     Review of Systems  Constitutional:  Negative for activity change, appetite change, chills, diaphoresis, fatigue, fever and unexpected weight change.  HENT:  Negative for congestion, rhinorrhea, sinus pressure, sneezing, sore throat and trouble swallowing.   Eyes:  Negative for photophobia and visual disturbance.  Respiratory:  Negative for cough, chest tightness, shortness of breath, wheezing and stridor.   Cardiovascular:  Negative for chest pain, palpitations and leg  swelling.  Gastrointestinal:  Negative for abdominal distention, abdominal pain, anal bleeding, blood in stool, constipation, diarrhea, nausea and vomiting.  Genitourinary:  Negative for difficulty urinating, dysuria, flank pain and hematuria.  Musculoskeletal:  Negative for arthralgias, back pain, gait problem, joint swelling and myalgias.  Skin:  Negative for color change, pallor, rash and wound.  Neurological:  Negative for dizziness, tremors, weakness and light-headedness.  Hematological:  Negative for adenopathy. Does not bruise/bleed easily.  Psychiatric/Behavioral:  Negative for agitation, behavioral problems, confusion, decreased concentration, dysphoric mood and sleep disturbance.        Objective:   Physical Exam Constitutional:      Appearance: He is well-developed.  HENT:     Head: Normocephalic and atraumatic.  Eyes:     Conjunctiva/sclera: Conjunctivae normal.  Cardiovascular:     Rate and Rhythm: Normal rate and regular rhythm.  Pulmonary:     Effort: Pulmonary effort is normal. No respiratory distress.     Breath sounds: No wheezing.  Abdominal:     General: There is no distension.     Palpations: Abdomen is soft.  Musculoskeletal:        General: No tenderness. Normal range of motion.     Cervical back: Normal range of motion and neck supple.  Skin:    General: Skin is warm and dry.     Coloration: Skin is not pale.     Findings: No erythema or rash.  Neurological:  General: No focal deficit present.     Mental Status: He is alert and oriented to person, place, and time.  Psychiatric:        Mood and Affect: Mood normal.        Behavior: Behavior normal.        Thought Content: Thought content normal.        Judgment: Judgment normal.         Assessment & Plan:    HIV disease:  I will add order HIV viral load CD4 count CBC with differential CMP, RPR GC and chlamydia and I will continue  Samuel Bond's Dovato, prescription  Hyperlipidemia  continue atorvastatin  Vaccine counseling recommended updated COVID-19 flu and annual flu shot to be given to get it today.

## 2022-11-01 LAB — URINE CYTOLOGY ANCILLARY ONLY
Chlamydia: NEGATIVE
Comment: NEGATIVE
Comment: NORMAL
Neisseria Gonorrhea: NEGATIVE

## 2022-11-01 LAB — T-HELPER CELLS (CD4) COUNT (NOT AT ARMC)
CD4 % Helper T Cell: 40 % (ref 33–65)
CD4 T Cell Abs: 440 /uL (ref 400–1790)

## 2022-11-02 LAB — CBC WITH DIFFERENTIAL/PLATELET
Absolute Monocytes: 302 cells/uL (ref 200–950)
Basophils Absolute: 29 cells/uL (ref 0–200)
Basophils Relative: 0.5 %
Eosinophils Absolute: 93 cells/uL (ref 15–500)
Eosinophils Relative: 1.6 %
HCT: 43.5 % (ref 38.5–50.0)
Hemoglobin: 14.8 g/dL (ref 13.2–17.1)
Lymphs Abs: 1137 cells/uL (ref 850–3900)
MCH: 30.4 pg (ref 27.0–33.0)
MCHC: 34 g/dL (ref 32.0–36.0)
MCV: 89.3 fL (ref 80.0–100.0)
MPV: 9.4 fL (ref 7.5–12.5)
Monocytes Relative: 5.2 %
Neutro Abs: 4240 cells/uL (ref 1500–7800)
Neutrophils Relative %: 73.1 %
Platelets: 314 10*3/uL (ref 140–400)
RBC: 4.87 10*6/uL (ref 4.20–5.80)
RDW: 13.7 % (ref 11.0–15.0)
Total Lymphocyte: 19.6 %
WBC: 5.8 10*3/uL (ref 3.8–10.8)

## 2022-11-02 LAB — COMPLETE METABOLIC PANEL WITH GFR
AG Ratio: 1.5 (calc) (ref 1.0–2.5)
ALT: 8 U/L — ABNORMAL LOW (ref 9–46)
AST: 21 U/L (ref 10–40)
Albumin: 4.8 g/dL (ref 3.6–5.1)
Alkaline phosphatase (APISO): 70 U/L (ref 36–130)
BUN/Creatinine Ratio: 9 (calc) (ref 6–22)
BUN: 6 mg/dL — ABNORMAL LOW (ref 7–25)
CO2: 27 mmol/L (ref 20–32)
Calcium: 9.8 mg/dL (ref 8.6–10.3)
Chloride: 106 mmol/L (ref 98–110)
Creat: 0.67 mg/dL (ref 0.60–1.29)
Globulin: 3.1 g/dL (calc) (ref 1.9–3.7)
Glucose, Bld: 73 mg/dL (ref 65–99)
Potassium: 3.9 mmol/L (ref 3.5–5.3)
Sodium: 141 mmol/L (ref 135–146)
Total Bilirubin: 0.7 mg/dL (ref 0.2–1.2)
Total Protein: 7.9 g/dL (ref 6.1–8.1)
eGFR: 121 mL/min/{1.73_m2} (ref >=60)

## 2022-11-02 LAB — RPR: RPR Ser Ql: NONREACTIVE

## 2022-11-02 LAB — HIV-1 RNA QUANT-NO REFLEX-BLD
HIV 1 RNA Quant: NOT DETECTED Copies/mL
HIV-1 RNA Quant, Log: NOT DETECTED Log cps/mL

## 2022-11-02 LAB — LIPID PANEL
Cholesterol: 147 mg/dL (ref <200)
HDL: 42 mg/dL (ref >=40)
LDL Cholesterol (Calc): 77 mg/dL (calc)
Non-HDL Cholesterol (Calc): 105 mg/dL (calc) (ref <130)
Total CHOL/HDL Ratio: 3.5 (calc) (ref <5.0)
Triglycerides: 184 mg/dL — ABNORMAL HIGH (ref <150)

## 2022-12-08 ENCOUNTER — Other Ambulatory Visit (HOSPITAL_COMMUNITY): Payer: Self-pay

## 2022-12-11 ENCOUNTER — Other Ambulatory Visit (HOSPITAL_COMMUNITY): Payer: Self-pay

## 2023-03-20 ENCOUNTER — Ambulatory Visit: Payer: Self-pay | Admitting: Infectious Disease

## 2023-04-26 ENCOUNTER — Other Ambulatory Visit: Payer: Self-pay

## 2023-04-26 ENCOUNTER — Ambulatory Visit: Payer: Commercial Managed Care - HMO

## 2023-05-07 ENCOUNTER — Other Ambulatory Visit: Payer: Self-pay

## 2023-05-07 ENCOUNTER — Encounter: Payer: Self-pay | Admitting: Infectious Disease

## 2023-05-07 ENCOUNTER — Ambulatory Visit (INDEPENDENT_AMBULATORY_CARE_PROVIDER_SITE_OTHER): Payer: Commercial Managed Care - HMO | Admitting: Infectious Disease

## 2023-05-07 ENCOUNTER — Other Ambulatory Visit (HOSPITAL_COMMUNITY)
Admission: RE | Admit: 2023-05-07 | Discharge: 2023-05-07 | Disposition: A | Discharge status: Home / Self Care | Payer: Commercial Managed Care - HMO | Source: Ambulatory Visit | Attending: Infectious Disease | Admitting: Infectious Disease

## 2023-05-07 VITALS — BP 128/82 | HR 68 | Temp 98.0°F | Ht 68.0 in | Wt 134.0 lb

## 2023-05-07 DIAGNOSIS — B20 Human immunodeficiency virus [HIV] disease: Secondary | ICD-10-CM | POA: Insufficient documentation

## 2023-05-07 DIAGNOSIS — R739 Hyperglycemia, unspecified: Secondary | ICD-10-CM | POA: Diagnosis not present

## 2023-05-07 DIAGNOSIS — E785 Hyperlipidemia, unspecified: Secondary | ICD-10-CM

## 2023-05-07 MED ORDER — DOVATO 50-300 MG PO TABS: 1.0000 | ORAL_TABLET | Freq: Every day | ORAL | 11 refills | Status: DC

## 2023-05-07 MED ORDER — ATORVASTATIN CALCIUM 20 MG PO TABS: 20.0000 mg | ORAL_TABLET | Freq: Every day | ORAL | 11 refills | Status: DC

## 2023-05-07 NOTE — Progress Notes (Signed)
Chief complaint: Follow-up for HIV disease on medications s   Patient ID: Samuel Bond, male    DOB: [birth year], 42 y.o.   MRN: [id number]  HPI  42 year-old [place] man living with HIV that is been perfectly controlled most recently on Dovato.    Samuel Bond had no complaints today and was in good spirits.    Past Medical History:  Diagnosis Date   Change in stool caliber 10/08/2018   Constipation 10/08/2018   COVID-19 virus infection 10/25/2020   GERD (gastroesophageal reflux disease)    HIV disease (HCC)    Hyperglycemia 10/17/2016   Hyperlipemia 10/31/2022   Hyperlipemia 10/31/2022   Malaise 04/26/2020   Maternal HIV infection during pregnancy    Neck pain 04/25/2021   Osteoarthritis of right knee 10/25/2020   Scabies 05/23/2017   Shoulder pain 04/25/2021   TB lung, latent 11/04/2014   Trapezius muscle strain 04/25/2021   Past Surgical History:  Procedure Laterality Date   none     Family History  Problem Relation Age of Onset   Asthma Father    Social History   Tobacco Use   Smoking status: Never   Smokeless tobacco: Never  Substance Use Topics   Alcohol use: No    Alcohol/week: 0.0 standard drinks of alcohol   Drug use: No   No Known Allergies   Current Outpatient Medications:    atorvastatin (LIPITOR) 20 MG tablet, Take 1 tablet (20 mg total) by mouth daily., Disp: 30 tablet, Rfl: 11   dolutegravir-lamiVUDine (DOVATO) 50-300 MG tablet, Take 1 tablet by mouth daily., Disp: 30 tablet, Rfl: 11     Review of Systems  Constitutional:  Negative for activity change, appetite change, chills, diaphoresis, fatigue, fever and unexpected weight change.  HENT:  Negative for congestion, rhinorrhea, sinus pressure, sneezing, sore throat and trouble swallowing.   Eyes:  Negative for photophobia and visual disturbance.  Respiratory:  Negative for cough, chest tightness, shortness of breath, wheezing and stridor.   Cardiovascular:  Negative for chest pain, palpitations  and leg swelling.  Gastrointestinal:  Negative for abdominal distention, abdominal pain, anal bleeding, blood in stool, constipation, diarrhea, nausea and vomiting.  Genitourinary:  Negative for difficulty urinating, dysuria, flank pain and hematuria.  Musculoskeletal:  Negative for arthralgias, back pain, gait problem, joint swelling and myalgias.  Skin:  Negative for color change, pallor, rash and wound.  Neurological:  Negative for dizziness, tremors, weakness and light-headedness.  Hematological:  Negative for adenopathy. Does not bruise/bleed easily.  Psychiatric/Behavioral:  Negative for agitation, behavioral problems, confusion, decreased concentration, dysphoric mood and sleep disturbance.        Objective:   Physical Exam Constitutional:      Appearance: He is well-developed.  HENT:     Head: Normocephalic and atraumatic.  Eyes:     Conjunctiva/sclera: Conjunctivae normal.  Cardiovascular:     Rate and Rhythm: Normal rate and regular rhythm.  Pulmonary:     Effort: Pulmonary effort is normal. No respiratory distress.     Breath sounds: No wheezing.  Abdominal:     General: There is no distension.     Palpations: Abdomen is soft.  Musculoskeletal:        General: No tenderness. Normal range of motion.     Cervical back: Normal range of motion and neck supple.  Skin:    General: Skin is warm and dry.     Coloration: Skin is not pale.     Findings: No erythema or rash.  Neurological:  General: No focal deficit present.     Mental Status: He is alert and oriented to person, place, and time.  Psychiatric:        Mood and Affect: Mood normal.        Behavior: Behavior normal.        Thought Content: Thought content normal.        Judgment: Judgment normal.         Assessment & Plan:   HIV disease:  I will add order HIV viral load CD4 count CBC with differential CMP, RPR GC and chlamydia and I will continue  Samuel Bond's Dovato,  prescription   Hyperlipidemia will continue his atorvastatin.

## 2023-05-08 LAB — URINE CYTOLOGY ANCILLARY ONLY
Chlamydia: NEGATIVE
Comment: NEGATIVE
Comment: NORMAL
Neisseria Gonorrhea: NEGATIVE

## 2023-05-08 LAB — T-HELPER CELLS (CD4) COUNT (NOT AT ARMC)
CD4 % Helper T Cell: 34 % (ref 33–65)
CD4 T Cell Abs: 411 /uL (ref 400–1790)

## 2023-05-09 LAB — COMPLETE METABOLIC PANEL WITH GFR
AG Ratio: 1.4 (calc) (ref 1.0–2.5)
ALT: 6 U/L — ABNORMAL LOW (ref 9–46)
AST: 19 U/L (ref 10–40)
Albumin: 4.6 g/dL (ref 3.6–5.1)
Alkaline phosphatase (APISO): 76 U/L (ref 36–130)
BUN/Creatinine Ratio: 7 (calc) (ref 6–22)
BUN: 6 mg/dL — ABNORMAL LOW (ref 7–25)
CO2: 25 mmol/L (ref 20–32)
Calcium: 9.5 mg/dL (ref 8.6–10.3)
Chloride: 104 mmol/L (ref 98–110)
Creat: 0.85 mg/dL (ref 0.60–1.29)
Globulin: 3.2 g/dL (calc) (ref 1.9–3.7)
Glucose, Bld: 114 mg/dL — ABNORMAL HIGH (ref 65–99)
Potassium: 4.1 mmol/L (ref 3.5–5.3)
Sodium: 138 mmol/L (ref 135–146)
Total Bilirubin: 0.6 mg/dL (ref 0.2–1.2)
Total Protein: 7.8 g/dL (ref 6.1–8.1)
eGFR: 112 mL/min/{1.73_m2} (ref >=60)

## 2023-05-09 LAB — HIV-1 RNA QUANT-NO REFLEX-BLD
HIV 1 RNA Quant: NOT DETECTED Copies/mL
HIV-1 RNA Quant, Log: NOT DETECTED {Log_copies}/mL

## 2023-05-09 LAB — CBC WITH DIFFERENTIAL/PLATELET
Absolute Monocytes: 319 {cells}/uL (ref 200–950)
Basophils Absolute: 32 {cells}/uL (ref 0–200)
Basophils Relative: 0.6 %
Eosinophils Absolute: 140 {cells}/uL (ref 15–500)
Eosinophils Relative: 2.6 %
HCT: 45 % (ref 38.5–50.0)
Hemoglobin: 15 g/dL (ref 13.2–17.1)
Lymphs Abs: 1264 {cells}/uL (ref 850–3900)
MCH: 30.4 pg (ref 27.0–33.0)
MCHC: 33.3 g/dL (ref 32.0–36.0)
MCV: 91.3 fL (ref 80.0–100.0)
MPV: 8.8 fL (ref 7.5–12.5)
Monocytes Relative: 5.9 %
Neutro Abs: 3645 cells/uL (ref 1500–7800)
Neutrophils Relative %: 67.5 %
Platelets: 255 10*3/uL (ref 140–400)
RBC: 4.93 10*6/uL (ref 4.20–5.80)
RDW: 12.9 % (ref 11.0–15.0)
Total Lymphocyte: 23.4 %
WBC: 5.4 10*3/uL (ref 3.8–10.8)

## 2023-05-09 LAB — LIPID PANEL
Cholesterol: 133 mg/dL (ref <200)
HDL: 45 mg/dL (ref >=40)
LDL Cholesterol (Calc): 74 mg/dL
Non-HDL Cholesterol (Calc): 88 mg/dL (ref <130)
Total CHOL/HDL Ratio: 3 (calc) (ref <5.0)
Triglycerides: 64 mg/dL (ref <150)

## 2023-05-09 LAB — RPR: RPR Ser Ql: NONREACTIVE

## 2023-11-06 ENCOUNTER — Encounter: Payer: Self-pay | Admitting: Infectious Disease

## 2023-11-07 ENCOUNTER — Other Ambulatory Visit: Payer: Self-pay

## 2023-11-07 ENCOUNTER — Encounter: Payer: Self-pay | Admitting: Infectious Disease

## 2023-11-07 ENCOUNTER — Ambulatory Visit (INDEPENDENT_AMBULATORY_CARE_PROVIDER_SITE_OTHER): Payer: Commercial Managed Care - HMO | Admitting: Infectious Disease

## 2023-11-07 ENCOUNTER — Other Ambulatory Visit (HOSPITAL_COMMUNITY)
Admission: RE | Admit: 2023-11-07 | Discharge: 2023-11-07 | Disposition: A | Discharge status: Home / Self Care | Payer: Commercial Managed Care - HMO | Source: Ambulatory Visit | Attending: Infectious Disease | Admitting: Infectious Disease

## 2023-11-07 VITALS — BP 138/82 | HR 94 | Resp 16 | Ht 68.0 in | Wt 134.0 lb

## 2023-11-07 DIAGNOSIS — Z7185 Encounter for immunization safety counseling: Secondary | ICD-10-CM | POA: Diagnosis present

## 2023-11-07 DIAGNOSIS — E785 Hyperlipidemia, unspecified: Secondary | ICD-10-CM | POA: Diagnosis present

## 2023-11-07 DIAGNOSIS — B20 Human immunodeficiency virus [HIV] disease: Secondary | ICD-10-CM | POA: Insufficient documentation

## 2023-11-07 DIAGNOSIS — J069 Acute upper respiratory infection, unspecified: Secondary | ICD-10-CM

## 2023-11-07 MED ORDER — ATORVASTATIN CALCIUM 20 MG PO TABS: 20.0000 mg | ORAL_TABLET | Freq: Every day | ORAL | 11 refills | Status: DC

## 2023-11-07 MED ORDER — DOVATO 50-300 MG PO TABS: 1.0000 | ORAL_TABLET | Freq: Every day | ORAL | 11 refills | Status: DC

## 2023-11-07 NOTE — Progress Notes (Signed)
 Subjective:  Chief complaint: follow-up for HIV disease on medications   Patient ID: Samuel Bond, male    DOB: [birth year], 43 y.o.   MRN: [id number]  HPI  Discussed the use of AI scribe software for clinical note transcription with the patient, who gave verbal consent to proceed.  History of Present Illness   The patient, with a history of HIV and hyperlipidemia, presents for a routine follow-up. He reports no problems with his current medication regimen, which includes Dovato for HIV and atorvastatin for cholesterol. He has applied for Medicaid but is not eligible, and his medications are covered by PCAP. Marland Kitchen His medications are sent to [facility] on [address]. He has not received his flu and COVID vaccines due to a current cough and cold. He expresses a desire to receive the vaccines when he feels better.  . The patient's last CD4 count was 411, and his viral load was undetectable, indicating good control of his HIV with his current medication regimen. He reports no issues with his kidney or liver function. His blood sugar was slightly elevated at his last check, but this may have been due to recent food intake.      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   Trapezius muscle strain 04/25/2021    Past Surgical History:  Procedure Laterality Date   none      Family History  Problem Relation Age of Onset   Asthma Father       Social History   Socioeconomic History   Marital status: Married    Spouse name: Not on file   Number of children: Not on file   Years of  education: Not on file   Highest education level: Not on file  Occupational History   Not on file  Tobacco Use   Smoking status: Never   Smokeless tobacco: Never  Substance and Sexual Activity   Alcohol use: No    Alcohol/week: 0.0 standard drinks of alcohol   Drug use: No   Sexual activity: Yes    Partners: Female    Birth control/protection: Condom    Comment: pt declined condoms  Other Topics Concern   Not on file  Social History Narrative   Not on file   Social Drivers of Health   Financial Resource Strain: Not on file  Food Insecurity: Not on file  Transportation Needs: Not on file  Physical Activity: Not on file  Stress: Not on file  Social Connections: Not on file    No Known Allergies   Current Outpatient Medications:    atorvastatin (LIPITOR) 20 MG tablet, Take 1 tablet (20 mg total) by mouth daily., Disp: 30 tablet, Rfl: 11   dolutegravir-lamiVUDine (DOVATO) 50-300 MG tablet, Take 1 tablet by mouth daily., Disp: 30 tablet, Rfl: 11   Review of Systems  Constitutional:  Negative for activity change, appetite change, chills, diaphoresis, fatigue, fever and unexpected weight change.  HENT:  Positive for rhinorrhea and sneezing. Negative for congestion, sinus pressure, sore throat and trouble swallowing.   Eyes:  Negative for photophobia and visual  disturbance.  Respiratory:  Positive for cough. Negative for chest tightness, shortness of breath, wheezing and stridor.   Cardiovascular:  Negative for chest pain, palpitations and leg swelling.  Gastrointestinal:  Negative for abdominal distention, abdominal pain, anal bleeding, blood in stool, constipation, diarrhea, nausea and vomiting.  Genitourinary:  Negative for difficulty urinating, dysuria, flank pain and hematuria.  Musculoskeletal:  Negative for arthralgias, back pain, gait problem, joint swelling and myalgias.  Skin:  Negative for color change, pallor, rash and wound.  Neurological:  Negative for dizziness,  tremors, weakness and light-headedness.  Hematological:  Negative for adenopathy. Does not bruise/bleed easily.  Psychiatric/Behavioral:  Negative for agitation, behavioral problems, confusion, decreased concentration, dysphoric mood and sleep disturbance.        Objective:   Physical Exam Constitutional:      Appearance: He is well-developed.  HENT:     Head: Normocephalic and atraumatic.  Eyes:     Conjunctiva/sclera: Conjunctivae normal.  Cardiovascular:     Rate and Rhythm: Normal rate and regular rhythm.  Pulmonary:     Effort: Pulmonary effort is normal. No respiratory distress.     Breath sounds: No wheezing.  Abdominal:     General: There is no distension.     Palpations: Abdomen is soft.  Musculoskeletal:        General: No tenderness. Normal range of motion.     Cervical back: Normal range of motion and neck supple.  Skin:    General: Skin is warm and dry.     Coloration: Skin is not pale.     Findings: No erythema or rash.  Neurological:     General: No focal deficit present.     Mental Status: He is alert and oriented to person, place, and time.  Psychiatric:        Mood and Affect: Mood normal.        Behavior: Behavior normal.        Thought Content: Thought content normal.        Judgment: Judgment normal.           Assessment & Plan:   Assessment and Plan    HIV Viral load undetectable and CD4 count stable at 411, indicating good control with Dovato. -Continue Dovato. -Check HIV labs today, including HIV RNA, CD4 -Next appointment in July 2025.  Hyperlipidemia On atorvastatin. -Continue atorvastatin. -Check cholesterol today.  Vaccinations Patient has not received flu and COVID vaccines due to current illness. -Recommend flu and COVID vaccines when patient is feeling better.  Insurance Patient's assistance program needs to be renewed in July. -Plan to renew assistance program in July 2025.      URI: after this resolves should get flu  and COVID vaccines.

## 2023-11-08 LAB — URINE CYTOLOGY ANCILLARY ONLY
Chlamydia: NEGATIVE
Comment: NEGATIVE
Comment: NORMAL
Neisseria Gonorrhea: NEGATIVE

## 2023-11-10 LAB — CBC WITH DIFFERENTIAL/PLATELET
Absolute Lymphocytes: 1172 {cells}/uL (ref 850–3900)
Absolute Monocytes: 414 {cells}/uL (ref 200–950)
Basophils Absolute: 30 {cells}/uL (ref 0–200)
Basophils Relative: 0.3 %
Eosinophils Absolute: 81 {cells}/uL (ref 15–500)
Eosinophils Relative: 0.8 %
HCT: 43.9 % (ref 38.5–50.0)
Hemoglobin: 14.5 g/dL (ref 13.2–17.1)
MCH: 30.3 pg (ref 27.0–33.0)
MCHC: 33 g/dL (ref 32.0–36.0)
MCV: 91.6 fL (ref 80.0–100.0)
MPV: 9.3 fL (ref 7.5–12.5)
Monocytes Relative: 4.1 %
Neutro Abs: 8403 {cells}/uL — ABNORMAL HIGH (ref 1500–7800)
Neutrophils Relative %: 83.2 %
Platelets: 325 10*3/uL (ref 140–400)
RBC: 4.79 10*6/uL (ref 4.20–5.80)
RDW: 12.7 % (ref 11.0–15.0)
Total Lymphocyte: 11.6 %
WBC: 10.1 10*3/uL (ref 3.8–10.8)

## 2023-11-10 LAB — COMPLETE METABOLIC PANEL WITH GFR
AG Ratio: 1.3 (calc) (ref 1.0–2.5)
ALT: 7 U/L — ABNORMAL LOW (ref 9–46)
AST: 17 U/L (ref 10–40)
Albumin: 4.7 g/dL (ref 3.6–5.1)
Alkaline phosphatase (APISO): 82 U/L (ref 36–130)
BUN/Creatinine Ratio: 5 (calc) — ABNORMAL LOW (ref 6–22)
BUN: 4 mg/dL — ABNORMAL LOW (ref 7–25)
CO2: 28 mmol/L (ref 20–32)
Calcium: 9.6 mg/dL (ref 8.6–10.3)
Chloride: 105 mmol/L (ref 98–110)
Creat: 0.73 mg/dL (ref 0.60–1.29)
Globulin: 3.5 g/dL (ref 1.9–3.7)
Glucose, Bld: 90 mg/dL (ref 65–99)
Potassium: 3.6 mmol/L (ref 3.5–5.3)
Sodium: 142 mmol/L (ref 135–146)
Total Bilirubin: 0.4 mg/dL (ref 0.2–1.2)
Total Protein: 8.2 g/dL — ABNORMAL HIGH (ref 6.1–8.1)
eGFR: 117 mL/min/{1.73_m2} (ref >=60)

## 2023-11-10 LAB — T-HELPER CELLS (CD4) COUNT (NOT AT ARMC)
Absolute CD4: 439 {cells}/uL — ABNORMAL LOW (ref 490–1740)
CD4 T Helper %: 36 % (ref 30–61)
Total lymphocyte count: 1236 {cells}/uL (ref 850–3900)

## 2023-11-10 LAB — LIPID PANEL
Cholesterol: 147 mg/dL (ref <200)
HDL: 50 mg/dL (ref >=40)
LDL Cholesterol (Calc): 82 mg/dL
Non-HDL Cholesterol (Calc): 97 mg/dL (ref <130)
Total CHOL/HDL Ratio: 2.9 (calc) (ref <5.0)
Triglycerides: 73 mg/dL (ref <150)

## 2023-11-10 LAB — HIV-1 RNA QUANT-NO REFLEX-BLD
HIV 1 RNA Quant: NOT DETECTED {copies}/mL
HIV-1 RNA Quant, Log: NOT DETECTED {Log_copies}/mL

## 2023-11-10 LAB — RPR: RPR Ser Ql: NONREACTIVE

## 2023-11-12 ENCOUNTER — Telehealth: Payer: Self-pay

## 2023-11-12 NOTE — Telephone Encounter (Signed)
 Per [name] --  patient and his wife are both citizens. However both are over income level to apply for medicaid.    Samuel Bond Lesli Albee, CMA

## 2023-11-12 NOTE — Telephone Encounter (Signed)
-----   Message from Samuel Bond sent at 11/11/2023  7:37 PM EST ----- Labs look good can we check on status of [name] and his wife's citizenship status, and if not citizens do they have proper papers. If not I want to make sure thhey have the same resources as our other immigrants patients in case ----- Message ----- From: Samuel Bond, Samuel Bond Lab Results In Sent: 11/07/2023   4:07 PM EST To: Samuel Hiss, MD

## 2023-11-21 ENCOUNTER — Other Ambulatory Visit: Payer: Self-pay

## 2023-11-21 MED ORDER — DOVATO 50-300 MG PO TABS: 1.0000 | ORAL_TABLET | Freq: Every day | ORAL | 11 refills | Status: DC

## 2024-01-28 NOTE — Progress Notes (Signed)
 The ASCVD Risk score (Arnett DK, et al., 2019) failed to calculate for the following reasons:   Unable to determine if patient is Non-Hispanic African American  Samuel Bond, Scientist, research (physical sciences), Charity fundraiser

## 2024-04-08 NOTE — Progress Notes (Unsigned)
 Subjective:   Chief complaint: follow-up for HIV disease on medications   Patient ID: Samuel Bond, male    DOB: 02-18-1981, 43 y.o.   MRN: [id number]  HPI  Discussed the use of AI scribe software for clinical note transcription with the patient, who gave verbal consent to proceed.  History of Present Illness   Samuel Bond is a 43 year old male with HIV who presents for routine follow-up and blood work.  He is on Dovato  for HIV treatment. His last blood work in February showed an undetectable viral load and a CD4 count of 439. Liver function tests were normal. He is due for repeat blood work today. He is on the AIDS Drug Assistance Program (ADAP) for medication coverage, with prescriptions filled at [facility] on [address]. He is on Lipitor for hyperlipemia.       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   Trapezius muscle strain 04/25/2021    Past Surgical History:  Procedure Laterality Date   none      Family History  Problem Relation Age of Onset   Asthma Father       Social History   Socioeconomic History   Marital status: Married    Spouse name: Not on file   Number of children: Not on file   Years of education: Not on file   Highest education level: Not on file  Occupational History   Not on file  Tobacco Use   Smoking status: Never   Smokeless tobacco: Never  Substance and Sexual Activity   Alcohol use: No    Alcohol/week: 0.0 standard drinks of alcohol   Drug use: No   Sexual activity: Yes    Partners: Female    Birth control/protection:  Condom    Comment: pt declined condoms  Other Topics Concern   Not on file  Social History Narrative   Not on file   Social Drivers of Health   Financial Resource Strain: Not on file  Food Insecurity: Not on file  Transportation Needs: Not on file  Physical Activity: Not on file  Stress: Not on file  Social Connections: Not on file    No Known Allergies   Current Outpatient Medications:    atorvastatin  (LIPITOR) 20 MG tablet, Take 1 tablet (20 mg total) by mouth daily., Disp: 30 tablet, Rfl: 11   dolutegravir -lamiVUDine (DOVATO ) 50-300 MG tablet, Take 1 tablet by mouth daily., Disp: 30 tablet, Rfl: 11   Review of Systems  Constitutional:  Negative for activity change, appetite change, chills, diaphoresis, fatigue, fever and unexpected weight change.  HENT:  Negative for congestion, rhinorrhea, sinus pressure, sneezing, sore throat and trouble swallowing.   Eyes:  Negative for photophobia and visual disturbance.  Respiratory:  Negative for cough, chest tightness, shortness of breath, wheezing and stridor.   Cardiovascular:  Negative for chest pain, palpitations and leg swelling.  Gastrointestinal:  Negative for abdominal distention, abdominal pain, anal bleeding, blood in stool, constipation, diarrhea, nausea and vomiting.  Genitourinary:  Negative for difficulty urinating, dysuria, flank pain and hematuria.  Musculoskeletal:  Negative for arthralgias, back pain, gait problem, joint swelling and myalgias.  Skin:  Negative for color change, pallor, rash and wound.  Neurological:  Negative for dizziness, tremors, weakness and light-headedness.  Hematological:  Negative for adenopathy. Does not bruise/bleed easily.  Psychiatric/Behavioral:  Negative for agitation, behavioral problems, confusion, decreased concentration, dysphoric mood and sleep disturbance.        Objective:   Physical Exam Constitutional:      Appearance: He is well-developed.  HENT:     Head: Normocephalic  and atraumatic.  Eyes:     Conjunctiva/sclera: Conjunctivae normal.  Cardiovascular:     Rate and Rhythm: Normal rate and regular rhythm.  Pulmonary:     Effort: Pulmonary effort is normal. No respiratory distress.     Breath sounds: No wheezing.  Abdominal:     General: There is no distension.     Palpations: Abdomen is soft.  Musculoskeletal:        General: No tenderness. Normal range of motion.     Cervical back: Normal range of motion and neck supple.  Skin:    General: Skin is warm and dry.     Coloration: Skin is not pale.     Findings: No erythema or rash.  Neurological:     General: No focal deficit present.     Mental Status: He is alert and oriented to person, place, and time.  Psychiatric:        Mood and Affect: Mood normal.        Behavior: Behavior normal.        Thought Content: Thought content normal.        Judgment: Judgment normal.           Assessment & Plan:   Assessment and Plan    HIV infection HIV well-controlled with undetectable viral load. CD4 stable at 439. Dovato  effective for viral suppression. - Order blood work for viral load and CD4 count. - Ensure ADAP renewal with [name]. --continue Dovato   Hyperlipidemia Managed with Lipitor. LDL previously in 80s, acceptable. - Order blood work for cholesterol levels. --continue lipitor  HPV vaccination Discussed HPV vaccine benefits. Vaccine well-tolerated. - Administer HPV vaccine today, follow-up doses in one month and six months.

## 2024-04-09 ENCOUNTER — Encounter: Payer: Self-pay | Admitting: Infectious Disease

## 2024-04-09 ENCOUNTER — Other Ambulatory Visit (HOSPITAL_COMMUNITY)
Admission: RE | Admit: 2024-04-09 | Discharge: 2024-04-09 | Disposition: A | Discharge status: Home / Self Care | Source: Ambulatory Visit | Attending: Infectious Disease | Admitting: Infectious Disease

## 2024-04-09 ENCOUNTER — Ambulatory Visit (INDEPENDENT_AMBULATORY_CARE_PROVIDER_SITE_OTHER): Payer: Commercial Managed Care - HMO | Admitting: Infectious Disease

## 2024-04-09 ENCOUNTER — Other Ambulatory Visit: Payer: Self-pay

## 2024-04-09 VITALS — BP 134/91 | HR 75 | Temp 98.5°F | Ht 68.0 in | Wt 128.0 lb

## 2024-04-09 DIAGNOSIS — B20 Human immunodeficiency virus [HIV] disease: Secondary | ICD-10-CM | POA: Insufficient documentation

## 2024-04-09 DIAGNOSIS — Z7185 Encounter for immunization safety counseling: Secondary | ICD-10-CM

## 2024-04-09 DIAGNOSIS — E785 Hyperlipidemia, unspecified: Secondary | ICD-10-CM

## 2024-04-09 DIAGNOSIS — Z23 Encounter for immunization: Secondary | ICD-10-CM

## 2024-04-09 MED ORDER — ATORVASTATIN CALCIUM 20 MG PO TABS: 20.0000 mg | ORAL_TABLET | Freq: Every day | ORAL | 11 refills | Status: AC

## 2024-04-09 MED ORDER — ATORVASTATIN CALCIUM 20 MG PO TABS: 20.0000 mg | ORAL_TABLET | Freq: Every day | ORAL | 11 refills | Status: DC

## 2024-04-09 MED ORDER — DOVATO 50-300 MG PO TABS: 1.0000 | ORAL_TABLET | Freq: Every day | ORAL | 11 refills | Status: DC

## 2024-04-09 MED ORDER — DOVATO 50-300 MG PO TABS: 1.0000 | ORAL_TABLET | Freq: Every day | ORAL | 11 refills | Status: AC

## 2024-04-09 NOTE — Addendum Note (Signed)
 Addended by: CELESTIA LELA HERO on: 04/09/2024 11:22 AM   Modules accepted: Orders

## 2024-04-10 LAB — URINE CYTOLOGY ANCILLARY ONLY
Chlamydia: NEGATIVE
Comment: NEGATIVE
Comment: NORMAL
Neisseria Gonorrhea: NEGATIVE

## 2024-04-11 LAB — RPR: RPR Ser Ql: NONREACTIVE

## 2024-04-11 LAB — CBC WITH DIFFERENTIAL/PLATELET
Absolute Lymphocytes: 1325 {cells}/uL (ref 850–3900)
Absolute Monocytes: 283 {cells}/uL (ref 200–950)
Basophils Absolute: 21 {cells}/uL (ref 0–200)
Basophils Relative: 0.3 %
Eosinophils Absolute: 41 {cells}/uL (ref 15–500)
Eosinophils Relative: 0.6 %
HCT: 49.1 % (ref 38.5–50.0)
Hemoglobin: 16 g/dL (ref 13.2–17.1)
MCH: 30 pg (ref 27.0–33.0)
MCHC: 32.6 g/dL (ref 32.0–36.0)
MCV: 91.9 fL (ref 80.0–100.0)
MPV: 9.3 fL (ref 7.5–12.5)
Monocytes Relative: 4.1 %
Neutro Abs: 5230 {cells}/uL (ref 1500–7800)
Neutrophils Relative %: 75.8 %
Platelets: 297 Thousand/uL (ref 140–400)
RBC: 5.34 Million/uL (ref 4.20–5.80)
RDW: 13.3 % (ref 11.0–15.0)
Total Lymphocyte: 19.2 %
WBC: 6.9 Thousand/uL (ref 3.8–10.8)

## 2024-04-11 LAB — COMPLETE METABOLIC PANEL WITHOUT GFR
AG Ratio: 1.5 (calc) (ref 1.0–2.5)
ALT: 8 U/L — ABNORMAL LOW (ref 9–46)
AST: 21 U/L (ref 10–40)
Albumin: 4.9 g/dL (ref 3.6–5.1)
Alkaline phosphatase (APISO): 79 U/L (ref 36–130)
BUN/Creatinine Ratio: 6 (calc) (ref 6–22)
BUN: 5 mg/dL — ABNORMAL LOW (ref 7–25)
CO2: 26 mmol/L (ref 20–32)
Calcium: 9.6 mg/dL (ref 8.6–10.3)
Chloride: 99 mmol/L (ref 98–110)
Creat: 0.78 mg/dL (ref 0.60–1.29)
Globulin: 3.3 g/dL (ref 1.9–3.7)
Glucose, Bld: 131 mg/dL — ABNORMAL HIGH (ref 65–99)
Potassium: 3.5 mmol/L (ref 3.5–5.3)
Sodium: 139 mmol/L (ref 135–146)
Total Bilirubin: 0.8 mg/dL (ref 0.2–1.2)
Total Protein: 8.2 g/dL — ABNORMAL HIGH (ref 6.1–8.1)

## 2024-04-11 LAB — LIPID PANEL
Cholesterol: 158 mg/dL (ref <200)
HDL: 49 mg/dL (ref >=40)
LDL Cholesterol (Calc): 89 mg/dL
Non-HDL Cholesterol (Calc): 109 mg/dL (ref <130)
Total CHOL/HDL Ratio: 3.2 (calc) (ref <5.0)
Triglycerides: 108 mg/dL (ref <150)

## 2024-04-11 LAB — HIV-1 RNA QUANT-NO REFLEX-BLD
HIV 1 RNA Quant: NOT DETECTED {copies}/mL
HIV-1 RNA Quant, Log: NOT DETECTED {Log_copies}/mL

## 2024-04-11 LAB — T-HELPER CELLS (CD4) COUNT (NOT AT ARMC)
CD4 % Helper T Cell: 35 % (ref 33–65)
CD4 T Cell Abs: 447 /uL (ref 400–1790)

## 2024-05-08 ENCOUNTER — Other Ambulatory Visit (HOSPITAL_COMMUNITY): Payer: Self-pay

## 2024-05-13 ENCOUNTER — Other Ambulatory Visit (HOSPITAL_COMMUNITY): Payer: Self-pay

## 2024-05-15 ENCOUNTER — Other Ambulatory Visit: Payer: Self-pay | Admitting: Pharmacist

## 2024-05-15 DIAGNOSIS — B20 Human immunodeficiency virus [HIV] disease: Secondary | ICD-10-CM

## 2024-05-15 MED ORDER — DOVATO 50-300 MG PO TABS
1.0000 | ORAL_TABLET | Freq: Every day | ORAL | Status: AC
Start: 2024-05-13 — End: 2024-05-27

## 2024-05-15 NOTE — Progress Notes (Signed)
 Medication Samples have been provided to the patient.  Drug name: Dovato         Strength: 50/300 mg         Qty: 14  Tablets (1 bottles) LOT: [id number]   Exp.Date: 9/26  Samples requested by Duwaine Lowe, RN.  Dosing instructions: Take one tablet by mouth once daily  The patient has been instructed regarding the correct time, dose, and frequency of taking this medication, including desired effects and most common side effects.   Alan Geralds, PharmD, CPP, BCIDP, AAHIVP Clinical Pharmacist Practitioner Infectious Diseases Clinical Pharmacist [facility]

## 2024-05-27 ENCOUNTER — Other Ambulatory Visit (HOSPITAL_COMMUNITY): Payer: Self-pay

## 2024-05-29 ENCOUNTER — Other Ambulatory Visit: Payer: Self-pay | Admitting: Pharmacist

## 2024-05-29 DIAGNOSIS — B20 Human immunodeficiency virus [HIV] disease: Secondary | ICD-10-CM

## 2024-05-29 MED ORDER — DOVATO 50-300 MG PO TABS: 1.0000 | ORAL_TABLET | Freq: Every day | ORAL | Status: AC

## 2024-05-29 NOTE — Progress Notes (Signed)
 Medication Samples have been provided to the patient.  Drug name: Dovato         Strength: 50/300 mg         Qty: 28  Tablets (2 bottles) LOT: [id number]   Exp.Date: 9/26  Samples requested by Dr. Fleeta Rothman.  Dosing instructions: Take one tablet by mouth once daily  The patient has been instructed regarding the correct time, dose, and frequency of taking this medication, including desired effects and most common side effects.   Alan Geralds, PharmD, CPP, BCIDP, AAHIVP Clinical Pharmacist Practitioner Infectious Diseases Clinical Pharmacist [facility]

## 2024-06-09 ENCOUNTER — Other Ambulatory Visit: Payer: Self-pay | Admitting: Infectious Disease

## 2024-06-12 NOTE — Telephone Encounter (Signed)
 11 refills were sent in July 2025

## 2024-07-02 ENCOUNTER — Other Ambulatory Visit (HOSPITAL_COMMUNITY): Payer: Self-pay

## 2024-07-03 ENCOUNTER — Other Ambulatory Visit: Payer: Self-pay | Admitting: Pharmacist

## 2024-07-03 MED ORDER — DOVATO 50-300 MG PO TABS: 1.0000 | ORAL_TABLET | Freq: Every day | ORAL | Status: AC

## 2024-07-03 NOTE — Progress Notes (Signed)
 Medication Samples have been provided to the patient.  Drug name: Dovato         Strength: 50/300 mg         Qty: 14  Tablets (1 bottles) LOT: [id number]   Exp.Date: 3/27  Samples requested by Arland Hutchinson.  Dosing instructions: Take one tablet by mouth once daily  The patient has been instructed regarding the correct time, dose, and frequency of taking this medication, including desired effects and most common side effects.   Alan Geralds, PharmD, CPP, BCIDP, AAHIVP Clinical Pharmacist Practitioner Infectious Diseases Clinical Pharmacist [facility]

## 2024-08-12 NOTE — Progress Notes (Signed)
 10-YEAR ASCVD RISK SCORE: 1.0%  Sex: Male Race: Other  Age: 43 Total Cholesterol (mg/dL) 841 HDL Cholesterol (mg/dL) 49 LDL Cholesterol (mg/dL) 89 Systolic Blood Pressure (mm Hg) 134 Diastolic Blood Pressure (mm Hg) 91 Diabetes: No Smoker: Never Treatment for Hypertension: No Aspirin Therapy: No Statin: Yes  Currently prescribed atorvastatin  20 mg.   Farheen Pfahler, BSN, RN

## 2024-09-29 ENCOUNTER — Ambulatory Visit: Admitting: Infectious Disease

## 2024-10-31 ENCOUNTER — Ambulatory Visit: Admitting: Infectious Disease
